# Patient Record
Sex: Female | Born: 1970
Health system: Southern US, Community
[De-identification: ages and names within clinical notes are randomized; demographics above are authoritative.]

## PROBLEM LIST (undated history)

## (undated) DIAGNOSIS — R7301 Impaired fasting glucose: Secondary | ICD-10-CM

## (undated) DIAGNOSIS — Z973 Presence of spectacles and contact lenses: Secondary | ICD-10-CM

## (undated) DIAGNOSIS — E785 Hyperlipidemia, unspecified: Secondary | ICD-10-CM

## (undated) DIAGNOSIS — R748 Abnormal levels of other serum enzymes: Secondary | ICD-10-CM

## (undated) DIAGNOSIS — Z5189 Encounter for other specified aftercare: Secondary | ICD-10-CM

## (undated) DIAGNOSIS — I10 Essential (primary) hypertension: Secondary | ICD-10-CM

## (undated) HISTORY — DX: Encounter for other specified aftercare: Z51.89

## (undated) HISTORY — DX: Essential (primary) hypertension: I10

## (undated) HISTORY — DX: Presence of spectacles and contact lenses: Z97.3

## (undated) HISTORY — DX: Hyperlipidemia, unspecified: E78.5

## (undated) HISTORY — PX: BREAST EXCISIONAL BIOPSY: SUR124

---
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
## 2008-11-29 DIAGNOSIS — I10 Essential (primary) hypertension: Secondary | ICD-10-CM

## 2008-11-29 HISTORY — DX: Essential (primary) hypertension: I10

## 2008-11-29 HISTORY — PX: MYOMECTOMY: SHX85

## 2011-11-30 HISTORY — PX: OVARIAN CYST REMOVAL: SHX89

## 2016-12-30 DIAGNOSIS — I10 Essential (primary) hypertension: Secondary | ICD-10-CM | POA: Diagnosis not present

## 2017-01-17 ENCOUNTER — Encounter: Payer: Self-pay | Admitting: Medical

## 2017-01-17 ENCOUNTER — Ambulatory Visit (INDEPENDENT_AMBULATORY_CARE_PROVIDER_SITE_OTHER): Payer: BLUE CROSS/BLUE SHIELD | Admitting: Medical

## 2017-01-17 VITALS — BP 118/80 | HR 72 | Ht 67.75 in | Wt 150.6 lb

## 2017-01-17 DIAGNOSIS — I10 Essential (primary) hypertension: Secondary | ICD-10-CM | POA: Diagnosis not present

## 2017-01-17 DIAGNOSIS — Z0000 Encounter for general adult medical examination without abnormal findings: Secondary | ICD-10-CM | POA: Diagnosis not present

## 2017-01-17 LAB — POCT URINALYSIS DIPSTICK
BILIRUBIN UA: NEGATIVE
Blood, UA: NEGATIVE
Glucose, UA: NEGATIVE
Ketones, UA: NEGATIVE
LEUKOCYTES UA: NEGATIVE
Nitrite, UA: NEGATIVE
PH UA: 6.5
PROTEIN UA: NEGATIVE
Spec Grav, UA: 1.025
UROBILINOGEN UA: NEGATIVE

## 2017-01-17 LAB — COMPREHENSIVE METABOLIC PANEL
ALBUMIN: 3.9 g/dL (ref 3.6–5.1)
ALT: 11 U/L (ref 6–29)
AST: 16 U/L (ref 10–35)
Alkaline Phosphatase: 110 U/L (ref 33–115)
BUN: 11 mg/dL (ref 7–25)
CHLORIDE: 101 mmol/L (ref 98–110)
CO2: 28 mmol/L (ref 20–31)
Calcium: 9.3 mg/dL (ref 8.6–10.2)
Creat: 0.82 mg/dL (ref 0.50–1.10)
Glucose, Bld: 85 mg/dL (ref 65–99)
POTASSIUM: 4.1 mmol/L (ref 3.5–5.3)
Sodium: 138 mmol/L (ref 135–146)
TOTAL PROTEIN: 7.7 g/dL (ref 6.1–8.1)
Total Bilirubin: 0.4 mg/dL (ref 0.2–1.2)

## 2017-01-17 LAB — CBC
HEMATOCRIT: 37.4 % (ref 35.0–45.0)
HEMOGLOBIN: 12 g/dL (ref 11.7–15.5)
MCH: 28.3 pg (ref 27.0–33.0)
MCHC: 32.1 g/dL (ref 32.0–36.0)
MCV: 88.2 fL (ref 80.0–100.0)
MPV: 9.3 fL (ref 7.5–12.5)
Platelets: 418 10*3/uL — ABNORMAL HIGH (ref 140–400)
RBC: 4.24 MIL/uL (ref 3.80–5.10)
RDW: 14.6 % (ref 11.0–15.0)
WBC: 7.9 10*3/uL (ref 4.0–10.5)

## 2017-01-17 LAB — LIPID PANEL
CHOL/HDL RATIO: 5.9 ratio — AB (ref <5.0)
Cholesterol: 183 mg/dL (ref <200)
HDL: 31 mg/dL — AB (ref >50)
LDL CALC: 134 mg/dL — AB (ref <100)
TRIGLYCERIDES: 91 mg/dL (ref <150)
VLDL: 18 mg/dL (ref <30)

## 2017-01-17 LAB — TSH: TSH: 0.8 mIU/L

## 2017-01-17 NOTE — Progress Notes (Signed)
Subjective:   HPI  Anne Bender is a 46 y.o. female who presents for a complete physical.  New patient today.  Medical team: Cardiology - [place] establishing today for primary care OB/Gyn - [facility] eye doctor Dr. Dimas Chyleglethorp  Concerns: Use [place], had PCP there.   Been in [place] 51mo.     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    Has degree and background in early childhood education and research.  Is on BP medication, diagnosed 2010, checks BPs, usually normal.   Takes both medications daily  Hx/o pregnancy x 6, 2 live births (one child is adopted), 4 miscarriages.   Not sure if she has had blood clot testing, but saw reproductive endocrinologist at one point.   Periods regular.  Husband had vasectomy.  Reviewed their medical, surgical, family, social, medication, and allergy history and updated chart as appropriate.  Past Medical History:  Diagnosis Date  . Blood transfusion without reported diagnosis    with prior gynecology surgery  . Hypertension 2010  . Wears glasses     Past Surgical History:  Procedure Laterality Date  . CESAREAN SECTION  2010  . MYOMECTOMY  2010  . OVARIAN CYST REMOVAL  2013    Social History   Social History  . Marital status: Married    Spouse name: N/A  . Number of children: N/A  . Years of education: N/A   Occupational History  . Not on file.   Social History Main Topics  . Smoking status: Never Smoker  . Smokeless tobacco: Never Used  . Alcohol use No  . Drug use: No  . Sexual activity: Not on file   Other Topics Concern  . Not on file   Social History Narrative   Married, has 3 children, ages 47yo, 347yo, 312yo.  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   Walks some for exercise.   12/2016    Family History  Problem Relation Age of Onset  . Stroke Mother [id number]  . Hypertension Mother    . Pulmonary embolism Father   . Hypertension Father   . Peripheral vascular disease Father   . Diabetes Paternal Grandmother   . Hypertension Paternal Grandmother   . Cancer Paternal Grandmother     breast  . Cancer Maternal Aunt     breast     Current Outpatient Prescriptions:  .  metoprolol succinate (TOPROL-XL) 25 MG 24 hr tablet, Take 25 mg by mouth daily., Disp: , Rfl: 0 .  valsartan-hydrochlorothiazide (DIOVAN-HCT) 160-12.5 MG tablet, Take 1 tablet by mouth daily., Disp: , Rfl: 2  No Known Allergies    Review of Systems Constitutional: -fever, -chills, -sweats, -unexpected weight change, -decreased appetite, -fatigue Allergy: -sneezing, -itching, -congestion Dermatology: -changing moles, --rash, -lumps ENT: -runny nose, -ear pain, -sore throat, -hoarseness, -sinus pain, -teeth pain, - ringing in ears, -hearing loss, -nosebleeds Cardiology: -chest pain, -palpitations, -swelling, -difficulty breathing when lying flat, -waking up short of breath Respiratory: -cough, -shortness of breath, -difficulty breathing with exercise or exertion, -wheezing, -coughing up blood Gastroenterology: -abdominal pain, -nausea, -vomiting, -diarrhea, -constipation, -blood in stool, -changes in bowel movement, -difficulty swallowing or eating Hematology: -bleeding, -bruising  Musculoskeletal: -joint aches, -muscle aches, -joint swelling, -back pain, -neck pain, -cramping, -changes in gait Ophthalmology: denies vision changes, eye redness, itching, discharge Urology: -burning with urination, -difficulty urinating, -blood in urine, -urinary frequency, -urgency, -incontinence  Neurology: -headache, -weakness, -tingling, -numbness, -memory loss, -falls, -dizziness Psychology: -depressed mood, -agitation, -sleep problems     Objective:   Physical Exam  BP 118/80   Pulse 72   Ht 5' 7.75" (1.721 m)   Wt 150 lb 9.6 oz (68.3 kg)   LMP 12/27/2016   BMI 23.07 kg/m   General appearance: alert, no  distress, WD/WN, AA female Skin: left lateral orbit with 2 small pedunculated skin tags, other flat tags of bilat cheeks.   Some freckles noted.  No worrisome lesions HEENT: normocephalic, conjunctiva/corneas normal, sclerae anicteric, PERRLA, EOMi, nares patent, no discharge or erythema, pharynx normal Oral cavity: MMM, tongue normal, teeth normal Neck: supple, no lymphadenopathy, no thyromegaly, no masses, normal ROM, no bruits Chest: non tender, normal shape and expansion Heart: RRR, normal S1, S2, no murmurs Lungs: CTA bilaterally, no wheezes, rhonchi, or rales Abdomen: +bs, soft, non tender, non distended, no masses, no hepatomegaly, no splenomegaly, no bruits Back: non tender, normal ROM, no scoliosis Musculoskeletal: upper extremities non tender, no obvious deformity, normal ROM throughout, lower extremities non tender, no obvious deformity, normal ROM throughout Extremities: no edema, no cyanosis, no clubbing Pulses: 2+ symmetric, upper and lower extremities, normal cap refill Neurological: alert, oriented x 3, CN2-12 intact, strength normal upper extremities and lower extremities, sensation normal throughout, DTRs 2+ throughout, no cerebellar signs, gait normal Psychiatric: normal affect, behavior normal, pleasant  Breast/gyn/rectal - deferred to gyn    Assessment and Plan :    Encounter Diagnoses  Name Primary?  . Routine general medical examination at a health care facility Yes  . Essential hypertension, benign     Physical exam - discussed healthy lifestyle, diet, exercise, preventative care, vaccinations, and addressed their concerns.  Handout given. See your eye doctor yearly for routine vision care. See your dentist yearly for routine dental care including hygiene visits twice yearly. She plans to establish with gyn discussed possibly doing a hypercoagulable panel given her and father's history.   She will consider C/t same BP medication She is up to date on flu and Td  vaccines.  Discussed cancer screens, pap, mammo, at age 1yo colonoscopy Follow-up pending labs  Anne Bender was seen today for new pt.  Diagnoses and all orders for this visit:  Routine general medical examination at a health care facility -     Urinalysis Dipstick -     Comprehensive metabolic panel -     CBC -     Lipid panel -     TSH  Essential hypertension, benign -     Comprehensive metabolic panel -     Lipid panel

## 2017-01-17 NOTE — Patient Instructions (Signed)
Dr. Jonna Coup, dentist [address], [place], [place] [address] [phone] [email or web address]   Health Maintenance, Female Introduction Adopting a healthy lifestyle and getting preventive care can go a long way to promote health and wellness. Talk with your health care provider about what schedule of regular examinations is right for you. This is a good chance for you to check in with your provider about disease prevention and staying healthy. In between checkups, there are plenty of things you can do on your own. Experts have done a lot of research about which lifestyle changes and preventive measures are most likely to keep you healthy. Ask your health care provider for more information. Weight and diet Eat a healthy diet  Be sure to include plenty of vegetables, fruits, low-fat dairy products, and lean protein.  Do not eat a lot of foods high in solid fats, added sugars, or salt.  Get regular exercise. This is one of the most important things you can do for your health.  Most adults should exercise for at least 150 minutes each week. The exercise should increase your heart rate and make you sweat (moderate-intensity exercise).  Most adults should also do strengthening exercises at least twice a week. This is in addition to the moderate-intensity exercise. Maintain a healthy weight  Body mass index (BMI) is a measurement that can be used to identify possible weight problems. It estimates body fat based on height and weight. Your health care provider can help determine your BMI and help you achieve or maintain a healthy weight.  For females 47 years of age and older:  A BMI below 18.5 is considered underweight.  A BMI of 18.5 to 24.9 is normal.  A BMI of 25 to 29.9 is considered overweight.  A BMI of 30 and above is considered obese. Watch levels of cholesterol and blood lipids  You should start having your blood tested for lipids and cholesterol at 47 years of age, then have  this test every 5 years.  You may need to have your cholesterol levels checked more often if:  Your lipid or cholesterol levels are high.  You are older than 47 years of age.  You are at high risk for heart disease. Cancer screening Lung Cancer  Lung cancer screening is recommended for adults 10-47 years old who are at high risk for lung cancer because of a history of smoking.  A yearly low-dose CT scan of the lungs is recommended for people who:  Currently smoke.  Have quit within the past 15 years.  Have at least a 30-pack-year history of smoking. A pack year is smoking an average of one pack of cigarettes a day for 1 year.  Yearly screening should continue until it has been 15 years since you quit.  Yearly screening should stop if you develop a health problem that would prevent you from having lung cancer treatment. Breast Cancer  Practice breast self-awareness. This means understanding how your breasts normally appear and feel.  It also means doing regular breast self-exams. Let your health care provider know about any changes, no matter how small.  If you are in your 47 or 30s, you should have a clinical breast exam (CBE) by a health care provider every 1-3 years as part of a regular health exam.  If you are 47 or older, have a CBE every year. Also consider having a breast X-ray (mammogram) every year.  If you have a family history of breast cancer, talk to your health  care provider about genetic screening.  If you are at high risk for breast cancer, talk to your health care provider about having an MRI and a mammogram every year.  Breast cancer gene (BRCA) assessment is recommended for women who have family members with BRCA-related cancers. BRCA-related cancers include:  Breast.  Ovarian.  Tubal.  Peritoneal cancers.  Results of the assessment will determine the need for genetic counseling and BRCA1 and BRCA2 testing. Cervical Cancer  Your health care  provider may recommend that you be screened regularly for cancer of the pelvic organs (ovaries, uterus, and vagina). This screening involves a pelvic examination, including checking for microscopic changes to the surface of your cervix (Pap test). You may be encouraged to have this screening done every 3 years, beginning at age 47.  For women ages 47-65, health care providers may recommend pelvic exams and Pap testing every 3 years, or they may recommend the Pap and pelvic exam, combined with testing for human papilloma virus (HPV), every 5 years. Some types of HPV increase your risk of cervical cancer. Testing for HPV may also be done on women of any age with unclear Pap test results.  Other health care providers may not recommend any screening for nonpregnant women who are considered low risk for pelvic cancer and who do not have symptoms. Ask your health care provider if a screening pelvic exam is right for you.  If you have had past treatment for cervical cancer or a condition that could lead to cancer, you need Pap tests and screening for cancer for at least 20 years after your treatment. If Pap tests have been discontinued, your risk factors (such as having a new sexual partner) need to be reassessed to determine if screening should resume. Some women have medical problems that increase the chance of getting cervical cancer. In these cases, your health care provider may recommend more frequent screening and Pap tests. Colorectal Cancer  This type of cancer can be detected and often prevented.  Routine colorectal cancer screening usually begins at 47 years of age and continues through 47 years of age.  Your health care provider may recommend screening at an earlier age if you have risk factors for colon cancer.  Your health care provider may also recommend using home test kits to check for hidden blood in the stool.  A small camera at the end of a tube can be used to examine your colon directly  (sigmoidoscopy or colonoscopy). This is done to check for the earliest forms of colorectal cancer.  Routine screening usually begins at age 47.  Direct examination of the colon should be repeated every 5-10 years through 47 years of age. However, you may need to be screened more often if early forms of precancerous polyps or small growths are found. Skin Cancer  Check your skin from head to toe regularly.  Tell your health care provider about any new moles or changes in moles, especially if there is a change in a mole's shape or color.  Also tell your health care provider if you have a mole that is larger than the size of a pencil eraser.  Always use sunscreen. Apply sunscreen liberally and repeatedly throughout the day.  Protect yourself by wearing long sleeves, pants, a wide-brimmed hat, and sunglasses whenever you are outside. Heart disease, diabetes, and high blood pressure  High blood pressure causes heart disease and increases the risk of stroke. High blood pressure is more likely to develop in:  People  who have blood pressure in the high end of the normal range (130-139/85-89 mm Hg).  People who are overweight or obese.  People who are African American.  If you are 6-38 years of age, have your blood pressure checked every 3-5 years. If you are 89 years of age or older, have your blood pressure checked every year. You should have your blood pressure measured twice-once when you are at a hospital or clinic, and once when you are not at a hospital or clinic. Record the average of the two measurements. To check your blood pressure when you are not at a hospital or clinic, you can use:  An automated blood pressure machine at a pharmacy.  A home blood pressure monitor.  If you are between 64 years and 3 years old, ask your health care provider if you should take aspirin to prevent strokes.  Have regular diabetes screenings. This involves taking a blood sample to check your  fasting blood sugar level.  If you are at a normal weight and have a low risk for diabetes, have this test once every three years after 47 years of age.  If you are overweight and have a high risk for diabetes, consider being tested at a younger age or more often. Preventing infection Hepatitis B  If you have a higher risk for hepatitis B, you should be screened for this virus. You are considered at high risk for hepatitis B if:  You were born in a country where hepatitis B is common. Ask your health care provider which countries are considered high risk.  Your parents were born in a high-risk country, and you have not been immunized against hepatitis B (hepatitis B vaccine).  You have HIV or AIDS.  You use needles to inject street drugs.  You live with someone who has hepatitis B.  You have had sex with someone who has hepatitis B.  You get hemodialysis treatment.  You take certain medicines for conditions, including cancer, organ transplantation, and autoimmune conditions. Hepatitis C  Blood testing is recommended for:  Everyone born from [birth year] through 1966.  Anyone with known risk factors for hepatitis C. Sexually transmitted infections (STIs)  You should be screened for sexually transmitted infections (STIs) including gonorrhea and chlamydia if:  You are sexually active and are younger than 47 years of age.  You are older than 47 years of age and your health care provider tells you that you are at risk for this type of infection.  Your sexual activity has changed since you were last screened and you are at an increased risk for chlamydia or gonorrhea. Ask your health care provider if you are at risk.  If you do not have HIV, but are at risk, it may be recommended that you take a prescription medicine daily to prevent HIV infection. This is called pre-exposure prophylaxis (PrEP). You are considered at risk if:  You are sexually active and do not regularly use condoms or  know the HIV status of your partner(s).  You take drugs by injection.  You are sexually active with a partner who has HIV. Talk with your health care provider about whether you are at high risk of being infected with HIV. If you choose to begin PrEP, you should first be tested for HIV. You should then be tested every 3 months for as long as you are taking PrEP. Pregnancy  If you are premenopausal and you may become pregnant, ask your health care provider about preconception  counseling.  If you may become pregnant, take 400 to 800 micrograms (mcg) of folic acid every day.  If you want to prevent pregnancy, talk to your health care provider about birth control (contraception). Osteoporosis and menopause  Osteoporosis is a disease in which the bones lose minerals and strength with aging. This can result in serious bone fractures. Your risk for osteoporosis can be identified using a bone density scan.  If you are 74 years of age or older, or if you are at risk for osteoporosis and fractures, ask your health care provider if you should be screened.  Ask your health care provider whether you should take a calcium or vitamin D supplement to lower your risk for osteoporosis.  Menopause may have certain physical symptoms and risks.  Hormone replacement therapy may reduce some of these symptoms and risks. Talk to your health care provider about whether hormone replacement therapy is right for you. Follow these instructions at home:  Schedule regular health, dental, and eye exams.  Stay current with your immunizations.  Do not use any tobacco products including cigarettes, chewing tobacco, or electronic cigarettes.  If you are pregnant, do not drink alcohol.  If you are breastfeeding, limit how much and how often you drink alcohol.  Limit alcohol intake to no more than 1 drink per day for nonpregnant women. One drink equals 12 ounces of beer, 5 ounces of wine, or 1 ounces of hard  liquor.  Do not use street drugs.  Do not share needles.  Ask your health care provider for help if you need support or information about quitting drugs.  Tell your health care provider if you often feel depressed.  Tell your health care provider if you have ever been abused or do not feel safe at home. This information is not intended to replace advice given to you by your health care provider. Make sure you discuss any questions you have with your health care provider. Document Released: 05/31/2011 Document Revised: 04/22/2016 Document Reviewed: 08/19/2015  2017 Elsevier

## 2017-01-18 ENCOUNTER — Other Ambulatory Visit: Payer: Self-pay | Admitting: Medical

## 2017-01-18 MED ORDER — METOPROLOL SUCCINATE ER 25 MG PO TB24: 25.0000 mg | ORAL_TABLET | Freq: Every day | ORAL | 1 refills | Status: DC

## 2017-01-18 MED ORDER — VALSARTAN-HYDROCHLOROTHIAZIDE 160-12.5 MG PO TABS: 1.0000 | ORAL_TABLET | Freq: Every day | ORAL | 1 refills | Status: DC

## 2017-01-28 ENCOUNTER — Encounter: Payer: Self-pay | Admitting: Medical

## 2017-06-29 ENCOUNTER — Encounter: Payer: Self-pay | Admitting: Medical

## 2017-06-29 ENCOUNTER — Ambulatory Visit (INDEPENDENT_AMBULATORY_CARE_PROVIDER_SITE_OTHER): Payer: BLUE CROSS/BLUE SHIELD | Admitting: Medical

## 2017-06-29 ENCOUNTER — Telehealth: Payer: Self-pay

## 2017-06-29 VITALS — BP 124/78 | HR 80 | Wt 158.0 lb

## 2017-06-29 DIAGNOSIS — I10 Essential (primary) hypertension: Secondary | ICD-10-CM

## 2017-06-29 DIAGNOSIS — R748 Abnormal levels of other serum enzymes: Secondary | ICD-10-CM

## 2017-06-29 MED ORDER — TELMISARTAN-HCTZ 40-12.5 MG PO TABS: 1.0000 | ORAL_TABLET | Freq: Every day | ORAL | 1 refills | Status: DC

## 2017-06-29 NOTE — Telephone Encounter (Signed)
Attempted to obtain requested medical records from Dr. Elinor ParkinsonKunesh office in [place]. Faxed request on 12/29/2016, and 01/14/2017. Mailed request on 01/28/2017. Dr. Leafy HalfKunesh's office closed on June 14th and all records are handled by [facility], [phone].   Will attempt to contact centralized medical records for records. Faxed request to them at [phone]. Trixie Rude/RLB

## 2017-06-29 NOTE — Progress Notes (Signed)
Subjective: Chief Complaint  Patient presents with  . med check and fasting labs    med check Nand fasting labs, wants to change her b/p meds.   Here for f/u on abnormal lipids and BP from 12/2016.    Walking for exercise about 2-3 days per week.  Was doing a exercise challenge at work in February, lasted 2 months .  Was walking every day.  Likes tennis and racquet ball.    Played tennis in college.  Diet is not so healthy at times.  She is compliant with Diovan HCT but needs to change this due to national recall on valsartan.  Not currently on lipid medication.  No other aggravating or relieving factors. No other complaint.   Past Medical History:  Diagnosis Date  . Blood transfusion without reported diagnosis    with prior gynecology surgery  . Hypertension 2010  . Wears glasses    Current Outpatient Prescriptions on File Prior to Visit  Medication Sig Dispense Refill  . metoprolol succinate (TOPROL-XL) 25 MG 24 hr tablet Take 1 tablet (25 mg total) by mouth daily. 90 tablet 1   No current facility-administered medications on file prior to visit.    ROS as in subjective    Objective: BP 124/78   Pulse 80   Wt 158 lb (71.7 kg)   SpO2 98%   BMI 24.20 kg/m   General appearance: alert, no distress, WD/WN,  Neck: supple, no lymphadenopathy, no thyromegaly, no masses Heart: RRR, normal S1, S2, no murmurs Lungs: CTA bilaterally, no wheezes, rhonchi, or rales Ext: no edema Pulses: 2+ symmetric, upper and lower extremities, normal cap refill   Assessment: Encounter Diagnoses  Name Primary?  . Essential hypertension, benign Yes  . Low serum HDL     Plan: HTN - due to national recall on valsartan, change to Micardis HCT ,monitor BPs, and let me know within 22mo if readings are WNL.  Discussed risks/benefits of medication, of note, husband has had vasectomy  Low HDL - discussed heart healthy exercise, healthy diet, healthy oils in diet.   Discussed lipid panel from  12/2016.   Repeat lipids today.   Discussed goals and reducing heart disease risk.  Nasira was seen today for med check and fasting labs.  Diagnoses and all orders for this visit:  Essential hypertension, benign  Low serum HDL -     Lipid panel  Other orders -     telmisartan-hydrochlorothiazide (MICARDIS HCT) 40-12.5 MG tablet; Take 1 tablet by mouth daily.

## 2017-06-30 ENCOUNTER — Other Ambulatory Visit: Payer: Self-pay | Admitting: Medical

## 2017-06-30 DIAGNOSIS — E782 Mixed hyperlipidemia: Secondary | ICD-10-CM

## 2017-06-30 LAB — LIPID PANEL
Cholesterol: 180 mg/dL (ref <200)
HDL: 32 mg/dL — AB (ref >50)
LDL Cholesterol: 129 mg/dL — ABNORMAL HIGH (ref <100)
Total CHOL/HDL Ratio: 5.6 Ratio — ABNORMAL HIGH (ref <5.0)
Triglycerides: 97 mg/dL (ref <150)
VLDL: 19 mg/dL (ref <30)

## 2017-06-30 MED ORDER — PRAVASTATIN SODIUM 20 MG PO TABS: 20.0000 mg | ORAL_TABLET | Freq: Every evening | ORAL | 0 refills | Status: DC

## 2017-06-30 MED ORDER — METOPROLOL SUCCINATE ER 25 MG PO TB24: 25.0000 mg | ORAL_TABLET | Freq: Every day | ORAL | 1 refills | Status: DC

## 2017-07-12 DIAGNOSIS — Z01419 Encounter for gynecological examination (general) (routine) without abnormal findings: Secondary | ICD-10-CM | POA: Diagnosis not present

## 2017-07-12 DIAGNOSIS — R102 Pelvic and perineal pain: Secondary | ICD-10-CM | POA: Diagnosis not present

## 2017-07-12 DIAGNOSIS — Z124 Encounter for screening for malignant neoplasm of cervix: Secondary | ICD-10-CM | POA: Diagnosis not present

## 2017-07-12 DIAGNOSIS — Z1231 Encounter for screening mammogram for malignant neoplasm of breast: Secondary | ICD-10-CM | POA: Diagnosis not present

## 2017-07-12 LAB — HM MAMMOGRAPHY

## 2017-07-13 DIAGNOSIS — Z124 Encounter for screening for malignant neoplasm of cervix: Secondary | ICD-10-CM | POA: Diagnosis not present

## 2017-07-15 LAB — HM PAP SMEAR: HM PAP: NEGATIVE

## 2017-07-26 NOTE — Telephone Encounter (Signed)
Spoke with Anne Bender- she asked for me to refax request to her attention. Awaiting records.

## 2017-08-09 ENCOUNTER — Other Ambulatory Visit (INDEPENDENT_AMBULATORY_CARE_PROVIDER_SITE_OTHER): Payer: BLUE CROSS/BLUE SHIELD

## 2017-08-09 DIAGNOSIS — E782 Mixed hyperlipidemia: Secondary | ICD-10-CM | POA: Diagnosis not present

## 2017-08-09 DIAGNOSIS — Z23 Encounter for immunization: Secondary | ICD-10-CM | POA: Diagnosis not present

## 2017-08-09 LAB — LIPID PANEL
CHOL/HDL RATIO: 6.2 (calc) — AB (ref <5.0)
CHOLESTEROL: 156 mg/dL (ref <200)
HDL: 25 mg/dL — AB (ref >50)
LDL Cholesterol (Calc): 113 mg/dL (calc) — ABNORMAL HIGH
Non-HDL Cholesterol (Calc): 131 mg/dL (calc) — ABNORMAL HIGH (ref <130)
Triglycerides: 79 mg/dL (ref <150)

## 2017-08-09 LAB — ALT: ALT: 14 U/L (ref 6–29)

## 2017-08-12 ENCOUNTER — Other Ambulatory Visit: Payer: Self-pay | Admitting: Medical

## 2017-08-12 MED ORDER — PRAVASTATIN SODIUM 40 MG PO TABS: 40.0000 mg | ORAL_TABLET | Freq: Every evening | ORAL | 3 refills | Status: DC

## 2017-08-12 NOTE — Progress Notes (Signed)
pra

## 2017-08-17 DIAGNOSIS — R102 Pelvic and perineal pain: Secondary | ICD-10-CM | POA: Diagnosis not present

## 2017-08-17 DIAGNOSIS — D259 Leiomyoma of uterus, unspecified: Secondary | ICD-10-CM | POA: Diagnosis not present

## 2017-08-25 ENCOUNTER — Other Ambulatory Visit: Payer: Self-pay | Admitting: Medical

## 2017-09-05 ENCOUNTER — Ambulatory Visit (INDEPENDENT_AMBULATORY_CARE_PROVIDER_SITE_OTHER): Payer: BLUE CROSS/BLUE SHIELD | Admitting: Medical

## 2017-09-05 VITALS — BP 124/70 | HR 78 | Temp 98.4°F | Wt 157.0 lb

## 2017-09-05 DIAGNOSIS — J029 Acute pharyngitis, unspecified: Secondary | ICD-10-CM

## 2017-09-05 DIAGNOSIS — J020 Streptococcal pharyngitis: Secondary | ICD-10-CM | POA: Diagnosis not present

## 2017-09-05 LAB — POCT RAPID STREP A (OFFICE): Rapid Strep A Screen: POSITIVE — AB

## 2017-09-05 MED ORDER — AMOXICILLIN 500 MG PO CAPS: 500.0000 mg | ORAL_CAPSULE | Freq: Three times a day (TID) | ORAL | 0 refills | Status: DC

## 2017-09-05 NOTE — Progress Notes (Signed)
  Subjective: Anne Bender is a 46 y.o. female who presents for evaluation of sore throat.    Symptoms include  Bad sore throat, some cough, some mucous drainage, ear discomfort.   No fever, no NVD.   Using nothing for symptoms.  reports sick contacts, daughter has had cough for weeks.  Patient has not had a recent close exposure to someone with proven streptococcal pharyngitis.  No other aggravating or relieving factors.  No other c/o.  The following portions of the patient's history were reviewed and updated as appropriate: allergies, current medications, past medical history, past social history, past surgical history and problem list.  Past Medical History:  Diagnosis Date  . Blood transfusion without reported diagnosis    with prior gynecology surgery  . Hypertension 2010  . Wears glasses     ROS as in subjective    Objective: BP 124/70   Pulse 78   Temp 98.4 F (36.9 C)   Wt 157 lb (71.2 kg)   SpO2 99%   BMI 24.05 kg/m   General appearance: no distress, WD/WN, mildly ill-appearing HEENT: normocephalic, conjunctiva/corneas normal, sclerae anicteric, nares patent, no discharge or erythema, pharynx with erythema, tonsils bilat swollen, no abscess, no fluctuance, no exudate.  Oral cavity: MMM, no lesions  Neck: swollen shoddy anterior tender nodes, no thyromegaly Heart: RRR, normal S1, S2, no murmurs Lungs: CTA bilaterally, no wheezes, rhonchi, or rales   Laboratory Strep test done. Results:positive.     Assessment: Encounter Diagnoses  Name Primary?  . Strep pharyngitis Yes  . Sore throat      Plan: Advised that sore throat etiology appears to be bacterial.  Discussed symptoms, diagnosis, and possible complications including peritonsillar abscess formation.  Advised that they will be infectious for 24 hours after starting antibiotics.  Discussed means of prevention, precautions.  Supportive care recommended including OTC analgesics, salt water gargles, warm  fluids, good hydration, and rest.  Discussed signs or symptoms that would prompt immediate evaluation.   Call or return if worse or not improving in the next 2-3 days.  Patient voiced understanding of diagnosis, recommendations, and treatment plan.  Anne Bender was seen today for sore throat.  Diagnoses and all orders for this visit:  Strep pharyngitis  Sore throat -     Rapid Strep A  Other orders -     amoxicillin (AMOXIL) 500 MG capsule; Take 1 capsule (500 mg total) by mouth 3 (three) times daily.

## 2017-09-22 ENCOUNTER — Telehealth: Payer: Self-pay | Admitting: Medical

## 2017-09-22 NOTE — Telephone Encounter (Signed)
Rcvd New RX request for Telmisartan 40-12.5 mg #30. Along with a note from the pharmacy stating that insurance requires a new script.

## 2017-09-23 ENCOUNTER — Other Ambulatory Visit: Payer: Self-pay | Admitting: Medical

## 2017-09-23 MED ORDER — TELMISARTAN-HCTZ 40-12.5 MG PO TABS: 1.0000 | ORAL_TABLET | Freq: Every day | ORAL | 1 refills | Status: DC

## 2017-09-25 ENCOUNTER — Other Ambulatory Visit: Payer: Self-pay | Admitting: Medical

## 2017-09-27 ENCOUNTER — Telehealth: Payer: Self-pay | Admitting: Medical

## 2017-09-27 NOTE — Telephone Encounter (Signed)
Fax refill request  telmissartan-hctz 40-12.5mg   #30 written 08/25/17  Request for 90 day rx  Per insurance requirement

## 2017-09-27 NOTE — Telephone Encounter (Signed)
This was already done on 09/23/17 # 90 with 1 refill

## 2017-09-28 ENCOUNTER — Telehealth: Payer: Self-pay | Admitting: Family Medicine

## 2017-09-28 ENCOUNTER — Other Ambulatory Visit: Payer: Self-pay | Admitting: Medical

## 2017-09-28 MED ORDER — AMOXICILLIN 500 MG PO CAPS: 500.0000 mg | ORAL_CAPSULE | Freq: Three times a day (TID) | ORAL | 0 refills | Status: DC

## 2017-09-28 NOTE — Telephone Encounter (Signed)
Pt called she is at [place] in a conference.  She is starting to get the same symptoms from Strep throat.  [phone] [place]

## 2017-09-28 NOTE — Telephone Encounter (Signed)
Call it out to the [place] pharmacy

## 2017-09-28 NOTE — Telephone Encounter (Signed)
[facility] 

## 2017-10-10 ENCOUNTER — Other Ambulatory Visit: Payer: Self-pay | Admitting: Medical

## 2017-11-29 DIAGNOSIS — R748 Abnormal levels of other serum enzymes: Secondary | ICD-10-CM

## 2017-11-29 DIAGNOSIS — R7301 Impaired fasting glucose: Secondary | ICD-10-CM

## 2017-11-29 HISTORY — DX: Abnormal levels of other serum enzymes: R74.8

## 2017-11-29 HISTORY — DX: Impaired fasting glucose: R73.01

## 2018-01-11 DIAGNOSIS — N926 Irregular menstruation, unspecified: Secondary | ICD-10-CM | POA: Diagnosis not present

## 2018-03-29 ENCOUNTER — Other Ambulatory Visit: Payer: Self-pay | Admitting: Medical

## 2018-03-29 NOTE — Telephone Encounter (Signed)
Give #30 and get in for physical at this time

## 2018-03-31 ENCOUNTER — Other Ambulatory Visit: Payer: Self-pay | Admitting: Medical

## 2018-04-10 ENCOUNTER — Telehealth: Payer: Self-pay

## 2018-04-10 NOTE — Telephone Encounter (Signed)
Received voicemail from patient she need refill on Telmisartan-HCTZ  40-12.5 mg   Sent to her pharmacy.She does have an appt for CPE.

## 2018-04-11 NOTE — Telephone Encounter (Signed)
Refill was sent on 03/29/18 with 0 refills. Spoke to patient and she was notified of medication refill on 03/29/18. She was informed to call the office if she has any other questions about her prescription

## 2018-04-17 ENCOUNTER — Other Ambulatory Visit: Payer: Self-pay | Admitting: Medical

## 2018-04-17 NOTE — Telephone Encounter (Signed)
30 day supply was sent on 03/29/18 and patient was informed she needed to schedule an appointment. lmom today informing patient to call and schedule appt for further refills.

## 2018-04-26 ENCOUNTER — Encounter: Payer: Self-pay | Admitting: Medical

## 2018-05-11 ENCOUNTER — Encounter: Payer: Self-pay | Admitting: Medical

## 2018-05-17 ENCOUNTER — Encounter: Payer: Self-pay | Admitting: Medical

## 2018-05-17 ENCOUNTER — Ambulatory Visit (INDEPENDENT_AMBULATORY_CARE_PROVIDER_SITE_OTHER): Payer: BLUE CROSS/BLUE SHIELD | Admitting: Medical

## 2018-05-17 VITALS — BP 130/82 | HR 68 | Resp 16 | Ht 62.0 in | Wt 154.2 lb

## 2018-05-17 DIAGNOSIS — R0683 Snoring: Secondary | ICD-10-CM | POA: Diagnosis not present

## 2018-05-17 DIAGNOSIS — R5383 Other fatigue: Secondary | ICD-10-CM

## 2018-05-17 DIAGNOSIS — I10 Essential (primary) hypertension: Secondary | ICD-10-CM

## 2018-05-17 DIAGNOSIS — Z0000 Encounter for general adult medical examination without abnormal findings: Secondary | ICD-10-CM | POA: Diagnosis not present

## 2018-05-17 LAB — POCT URINALYSIS DIP (PROADVANTAGE DEVICE)
BILIRUBIN UA: NEGATIVE
GLUCOSE UA: NEGATIVE mg/dL
Ketones, POC UA: NEGATIVE mg/dL
Leukocytes, UA: NEGATIVE
Nitrite, UA: NEGATIVE
Protein Ur, POC: 30 mg/dL — AB
SPECIFIC GRAVITY, URINE: 1.03
UUROB: NEGATIVE
pH, UA: 6 (ref 5.0–8.0)

## 2018-05-17 NOTE — Progress Notes (Signed)
Subjective:   HPI  Anne Bender is a 47 y.o. female who presents for Chief Complaint  Patient presents with  . Annual Exam    fasting CPE    eye exam 03-19  Gyn appt due in Sept 2019 with Dr Normand Sloop     Medical care team includes: Delaina Fetsch, Kermit Balo, PA-C here for primary care Dentist Eye doctor Sees gynecology  Concerns: Fatigue in general  There has been comments about snoring, no witnessed apnea.  She notes recurrent sore throats in the past year.   Reviewed their medical, surgical, family, social, medication, and allergy history and updated chart as appropriate.  Past Medical History:  Diagnosis Date  . Blood transfusion without reported diagnosis    with prior gynecology surgery  . Hypertension 2010  . Wears glasses     Past Surgical History:  Procedure Laterality Date  . CESAREAN SECTION  2010  . MYOMECTOMY  2010  . OVARIAN CYST REMOVAL  2013    Social History   Socioeconomic History  . Marital status: Married    Spouse name: Not on file  . Number of children: Not on file  . Years of education: Not on file  . Highest education level: Not on file  Occupational History  . Not on file  Social Needs  . Financial resource strain: Not on file  . Food insecurity:    Worry: Not on file    Inability: Not on file  . Transportation needs:    Medical: Not on file    Non-medical: Not on file  Tobacco Use  . Smoking status: Never Smoker  . Smokeless tobacco: Never Used  Substance and Sexual Activity  . Alcohol use: No  . Drug use: No  . Sexual activity: Not on file  Lifestyle  . Physical activity:    Days per week: Not on file    Minutes per session: Not on file  . Stress: Not on file  Relationships  . Social connections:    Talks on phone: Not on file    Gets together: Not on file    Attends religious service: Not on file    Active member of club or organization: Not on file    Attends meetings of clubs or organizations: Not on file   Relationship status: Not on file  . Intimate partner violence:    Fear of current or ex partner: Not on file    Emotionally abused: Not on file    Physically abused: Not on file    Forced sexual activity: Not on file  Other Topics Concern  . Not on file  Social History Narrative   Married, has 3 children, [place], [place].  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   Walks some for exercise.   04/2018    Family History  Problem Relation Age of Onset  . Stroke Mother 79  . Hypertension Mother   . Pulmonary embolism Father   . Hypertension Father   . Peripheral vascular disease Father   . Diabetes Paternal Grandmother   . Hypertension Paternal Grandmother   . Cancer Paternal Grandmother        breast  . Cancer Maternal Aunt        breast     Current Outpatient Medications:  .  metoprolol succinate (TOPROL-XL) 25 MG 24 hr tablet, Take 1 tablet (25 mg total) by mouth daily., Disp: 90 tablet, Rfl: 1 .  pravastatin (PRAVACHOL) 20 MG tablet,  TAKE 1 TABLET BY MOUTH EVERY DAY IN THE EVENING, Disp: 90 tablet, Rfl: 0 .  telmisartan-hydrochlorothiazide (MICARDIS HCT) 40-12.5 MG tablet, TAKE 1 TABLET BY MOUTH EVERY DAY, Disp: 30 tablet, Rfl: 0  No Known Allergies   Review of Systems Constitutional: -fever, -chills, -sweats, -unexpected weight change, -decreased appetite, +fatigue Allergy: -sneezing, -itching, -congestion Dermatology: -changing moles, --rash, -lumps ENT: -runny nose, -ear pain, -sore throat, -hoarseness, -sinus pain, -teeth pain, - ringing in ears, -hearing loss, -nosebleeds Cardiology: -chest pain, -palpitations, -swelling, -difficulty breathing when lying flat, -waking up short of breath Respiratory: -cough, -shortness of breath, -difficulty breathing with exercise or exertion, -wheezing, -coughing up blood Gastroenterology: -abdominal pain, -nausea, -vomiting, -diarrhea, -constipation, -blood in stool, -changes in bowel movement,  -difficulty swallowing or eating Hematology: -bleeding, -bruising  Musculoskeletal: -joint aches, -muscle aches, -joint swelling, -back pain, -neck pain, -cramping, -changes in gait Ophthalmology: denies vision changes, eye redness, itching, discharge Urology: -burning with urination, -difficulty urinating, -blood in urine, -urinary frequency, -urgency, -incontinence Neurology: -headache, -weakness, -tingling, -numbness, -memory loss, -falls, -dizziness Psychology: -depressed mood, -agitation, -sleep problems Breast/gyn: -breast tenderness, -discharge, -lumps, -vaginal discharge,- irregular periods, -heavy periods     Objective:  BP 130/82   Pulse 68   Resp 16   Ht 5\' 2"  (1.575 m)   Wt 154 lb 3.2 oz (69.9 kg)   SpO2 98%   BMI 28.20 kg/m   General appearance: alert, no distress, WD/WN, African American female Skin: unremarkable HEENT: normocephalic, conjunctiva/corneas normal, sclerae anicteric, PERRLA, EOMi, nares patent, no discharge or erythema, pharynx normal Oral cavity: MMM, tongue normal, teeth in good repair, somewhat small airway Neck: supple, no lymphadenopathy, no thyromegaly, no masses, normal ROM, no bruits Chest: non tender, normal shape and expansion Heart: RRR, normal S1, S2, no murmurs Lungs: CTA bilaterally, no wheezes, rhonchi, or rales Abdomen: +bs, soft, non tender, non distended, no masses, no hepatomegaly, no splenomegaly, no bruits Back: non tender, normal ROM, no scoliosis Musculoskeletal: upper extremities non tender, no obvious deformity, normal ROM throughout, lower extremities non tender, no obvious deformity, normal ROM throughout Extremities: no edema, no cyanosis, no clubbing Pulses: 2+ symmetric, upper and lower extremities, normal cap refill Neurological: alert, oriented x 3, CN2-12 intact, strength normal upper extremities and lower extremities, sensation normal throughout, DTRs 2+ throughout, no cerebellar signs, gait normal Psychiatric: normal  affect, behavior normal, pleasant  Breast/gyn/rectal - deferred to gynecology    Adult ECG Report  Indication: physical, HTN  Rate: 58 bpm  Rhythm: sinus bradycardia  QRS Axis: 28 degrees  PR Interval:  QRS Duration: 88ms  QTc:  Conduction Disturbances: first-degree A-V block   Other Abnormalities: none  Patient's cardiac risk factors are: hypertension.  EKG comparison: none  Narrative Interpretation: sinus bradycardia, AV 1 block    Assessment and Plan :   Encounter Diagnoses  Name Primary?  . Routine general medical examination at a health care facility Yes  . Essential hypertension, benign   . Fatigue, unspecified type   . Snoring     Physical exam - discussed and counseled on healthy lifestyle, diet, exercise, preventative care, vaccinations, sick and well care, proper use of emergency dept and after hours care, and addressed their concerns.    Health screening: See your eye doctor yearly for routine vision care. See your dentist yearly for routine dental care including hygiene visits twice yearly.  Cancer screening Discussed and advised monthly self breast exams Mammogram and pap through gynecology  Colonoscopy:  She will call and  inquire about screening at 48yo vs 48yo  Vaccinations: Advised yearly influenza vaccine  Separate significant issues discussed: fatigue - labs today, consider sleep study, needs to work on increased exercise, healthy diet.  Of note, EKG reviewed after she left, type 1 AV block.  [place] was seen today for annual exam.  Diagnoses and all orders for this visit:  Routine general medical examination at a health care facility -     POCT Urinalysis DIP (Proadvantage Device) -     Comprehensive metabolic panel -     CBC -     TSH -     Lipid panel -     Hemoglobin A1c -     VITAMIN D 25 Hydroxy (Vit-D Deficiency, Fractures) -     EKG 12-Lead  Essential hypertension, benign -     Hemoglobin A1c  Fatigue, unspecified  type -     CBC -     TSH  Snoring   Follow-up pending labs, yearly for physical

## 2018-05-18 ENCOUNTER — Other Ambulatory Visit: Payer: Self-pay | Admitting: Medical

## 2018-05-18 LAB — LIPID PANEL
Chol/HDL Ratio: 4.6 ratio — ABNORMAL HIGH (ref 0.0–4.4)
Cholesterol, Total: 137 mg/dL (ref 100–199)
HDL: 30 mg/dL — AB (ref >39)
LDL Calculated: 94 mg/dL (ref 0–99)
TRIGLYCERIDES: 66 mg/dL (ref 0–149)
VLDL Cholesterol Cal: 13 mg/dL (ref 5–40)

## 2018-05-18 LAB — COMPREHENSIVE METABOLIC PANEL WITH GFR
ALT: 18 IU/L (ref 0–32)
AST: 20 IU/L (ref 0–40)
Albumin/Globulin Ratio: 1.1 — ABNORMAL LOW (ref 1.2–2.2)
Albumin: 3.8 g/dL (ref 3.5–5.5)
Alkaline Phosphatase: 127 IU/L — ABNORMAL HIGH (ref 39–117)
BUN/Creatinine Ratio: 12 (ref 9–23)
BUN: 11 mg/dL (ref 6–24)
Bilirubin Total: 0.3 mg/dL (ref 0.0–1.2)
CO2: 24 mmol/L (ref 20–29)
Calcium: 8.9 mg/dL (ref 8.7–10.2)
Chloride: 100 mmol/L (ref 96–106)
Creatinine, Ser: 0.95 mg/dL (ref 0.57–1.00)
GFR calc Af Amer: 82 mL/min/1.73
GFR calc non Af Amer: 72 mL/min/1.73
Globulin, Total: 3.4 g/dL (ref 1.5–4.5)
Glucose: 87 mg/dL (ref 65–99)
Potassium: 4.1 mmol/L (ref 3.5–5.2)
Sodium: 138 mmol/L (ref 134–144)
Total Protein: 7.2 g/dL (ref 6.0–8.5)

## 2018-05-18 LAB — CBC
Hematocrit: 36.5 % (ref 34.0–46.6)
Hemoglobin: 11.8 g/dL (ref 11.1–15.9)
MCH: 29.4 pg (ref 26.6–33.0)
MCHC: 32.3 g/dL (ref 31.5–35.7)
MCV: 91 fL (ref 79–97)
Platelets: 414 10*3/uL (ref 150–450)
RBC: 4.01 x10E6/uL (ref 3.77–5.28)
RDW: 14.2 % (ref 12.3–15.4)
WBC: 7.4 10*3/uL (ref 3.4–10.8)

## 2018-05-18 LAB — HEMOGLOBIN A1C
Est. average glucose Bld gHb Est-mCnc: 134 mg/dL
HEMOGLOBIN A1C: 6.3 % — AB (ref 4.8–5.6)

## 2018-05-18 LAB — TSH: TSH: 1.06 u[IU]/mL (ref 0.450–4.500)

## 2018-05-18 LAB — VITAMIN D 25 HYDROXY (VIT D DEFICIENCY, FRACTURES): Vit D, 25-Hydroxy: 33.2 ng/mL (ref 30.0–100.0)

## 2018-05-18 MED ORDER — TELMISARTAN-HCTZ 40-12.5 MG PO TABS: 1.0000 | ORAL_TABLET | Freq: Every day | ORAL | 3 refills | Status: DC

## 2018-05-18 MED ORDER — VITAMIN D 1000 UNITS PO TABS
1000.0000 [IU] | ORAL_TABLET | Freq: Every day | ORAL | 3 refills | Status: DC
Start: 2018-05-18 — End: 2019-05-07

## 2018-05-18 MED ORDER — PRAVASTATIN SODIUM 20 MG PO TABS: ORAL_TABLET | ORAL | 3 refills | Status: DC

## 2018-05-22 ENCOUNTER — Other Ambulatory Visit: Payer: Self-pay | Admitting: Medical

## 2018-05-22 MED ORDER — METOPROLOL SUCCINATE ER 25 MG PO TB24: 25.0000 mg | ORAL_TABLET | Freq: Every day | ORAL | 3 refills | Status: DC

## 2018-05-25 ENCOUNTER — Telehealth: Payer: Self-pay | Admitting: Medical

## 2018-05-25 NOTE — Telephone Encounter (Signed)
Received [facility]. Sending back for review.

## 2018-08-08 DIAGNOSIS — N921 Excessive and frequent menstruation with irregular cycle: Secondary | ICD-10-CM | POA: Diagnosis not present

## 2018-08-08 DIAGNOSIS — Z1231 Encounter for screening mammogram for malignant neoplasm of breast: Secondary | ICD-10-CM | POA: Diagnosis not present

## 2018-08-08 DIAGNOSIS — Z6828 Body mass index (BMI) 28.0-28.9, adult: Secondary | ICD-10-CM | POA: Diagnosis not present

## 2018-08-08 DIAGNOSIS — Z01419 Encounter for gynecological examination (general) (routine) without abnormal findings: Secondary | ICD-10-CM | POA: Diagnosis not present

## 2018-08-17 ENCOUNTER — Encounter: Payer: Self-pay | Admitting: Medical

## 2018-08-17 ENCOUNTER — Ambulatory Visit: Payer: BLUE CROSS/BLUE SHIELD | Admitting: Medical

## 2018-08-17 VITALS — BP 120/70 | HR 67 | Temp 97.7°F | Resp 16 | Ht 62.0 in | Wt 152.0 lb

## 2018-08-17 DIAGNOSIS — R7303 Prediabetes: Secondary | ICD-10-CM

## 2018-08-17 DIAGNOSIS — I10 Essential (primary) hypertension: Secondary | ICD-10-CM

## 2018-08-17 DIAGNOSIS — Z23 Encounter for immunization: Secondary | ICD-10-CM | POA: Diagnosis not present

## 2018-08-17 DIAGNOSIS — R8290 Unspecified abnormal findings in urine: Secondary | ICD-10-CM | POA: Diagnosis not present

## 2018-08-17 DIAGNOSIS — R748 Abnormal levels of other serum enzymes: Secondary | ICD-10-CM

## 2018-08-17 DIAGNOSIS — E559 Vitamin D deficiency, unspecified: Secondary | ICD-10-CM | POA: Diagnosis not present

## 2018-08-17 LAB — POCT URINALYSIS DIP (PROADVANTAGE DEVICE)
BILIRUBIN UA: NEGATIVE
BILIRUBIN UA: NEGATIVE mg/dL
Blood, UA: NEGATIVE
GLUCOSE UA: NEGATIVE mg/dL
Leukocytes, UA: NEGATIVE
Nitrite, UA: NEGATIVE
Protein Ur, POC: NEGATIVE mg/dL
SPECIFIC GRAVITY, URINE: 1.025
Urobilinogen, Ur: NEGATIVE
pH, UA: 6 (ref 5.0–8.0)

## 2018-08-17 NOTE — Progress Notes (Signed)
Subjective: Chief Complaint  Patient presents with  . follow up    fasting follow up   Since last visit quit a stressful job, happy about that.   Here today to follow-up on issues from her physical in June  She is compliant with her 2 blood pressure medicines her cholesterol medicine.  She is compliant with vitamin D 1000 units daily started last visit  She denies history of liver disease.  She is getting exercise, trying to eat healthy  No new complaints  Past Medical History:  Diagnosis Date  . Blood transfusion without reported diagnosis    with prior gynecology surgery  . Hypertension 2010  . Wears glasses    Current Outpatient Medications on File Prior to Visit  Medication Sig Dispense Refill  . cholecalciferol (VITAMIN D) 1000 units tablet Take 1 tablet (1,000 Units total) by mouth daily. 90 tablet 3  . metoprolol succinate (TOPROL-XL) 25 MG 24 hr tablet Take 1 tablet (25 mg total) by mouth daily. 90 tablet 3  . pravastatin (PRAVACHOL) 20 MG tablet TAKE 1 TABLET BY MOUTH EVERY DAY IN THE EVENING 90 tablet 3  . telmisartan-hydrochlorothiazide (MICARDIS HCT) 40-12.5 MG tablet Take 1 tablet by mouth daily. 90 tablet 3   No current facility-administered medications on file prior to visit.    ROS as in subjective   Objective: BP 120/70   Pulse 67   Temp 97.7 F (36.5 C) (Oral)   Resp 16   Ht 5\' 2"  (1.575 m)   Wt 152 lb (68.9 kg)   SpO2 97%   BMI 27.80 kg/m   Gen: wd, wn, nad Otherwise not examined    Assessment: Encounter Diagnoses  Name Primary?  . Alkaline phosphatase elevation Yes  . Vitamin D deficiency   . Low serum HDL   . Essential hypertension, benign   . Need for influenza vaccination   . Prediabetes      Plan: Hypertension-at goal, continue current medication  Low serum HDL-discussed diet, exercise, continue statin for risk factor reduction  Vitamin D deficiency-compliant with the thousand units daily from last visit, recheck lab  today  Elevated alkaline phosphatase- lab today, discussed possible causes, possibly related to low vitamin D.  Discussed other evaluation if the level is higher  Prediabetes-discussed lab findings last visit, diet, exercise, prevention of progression to diabetes  Counseled on the influenza virus vaccine.  Vaccine information sheet given.  Influenza vaccine given after consent obtained.   Lala was seen today for follow up.  Diagnoses and all orders for this visit:  Alkaline phosphatase elevation -     Hepatic function panel -     VITAMIN D 25 Hydroxy (Vit-D Deficiency, Fractures)  Vitamin D deficiency -     Hepatic function panel -     VITAMIN D 25 Hydroxy (Vit-D Deficiency, Fractures)  Low serum HDL  Essential hypertension, benign  Need for influenza vaccination  Prediabetes

## 2018-08-17 NOTE — Addendum Note (Signed)
Addended by: Derinda LateLAMPART, Davontae Prusinski G on: 08/17/2018 09:24 AM   Modules accepted: Orders

## 2018-08-18 LAB — HEPATIC FUNCTION PANEL
ALT: 16 IU/L (ref 0–32)
AST: 16 IU/L (ref 0–40)
Albumin: 3.9 g/dL (ref 3.5–5.5)
Alkaline Phosphatase: 139 IU/L — ABNORMAL HIGH (ref 39–117)
BILIRUBIN, DIRECT: 0.11 mg/dL (ref 0.00–0.40)
Bilirubin Total: 0.4 mg/dL (ref 0.0–1.2)
Total Protein: 7.3 g/dL (ref 6.0–8.5)

## 2018-08-18 LAB — VITAMIN D 25 HYDROXY (VIT D DEFICIENCY, FRACTURES): Vit D, 25-Hydroxy: 40.9 ng/mL (ref 30.0–100.0)

## 2018-08-21 ENCOUNTER — Other Ambulatory Visit: Payer: Self-pay | Admitting: Medical

## 2018-08-21 DIAGNOSIS — R748 Abnormal levels of other serum enzymes: Secondary | ICD-10-CM

## 2018-08-22 LAB — ALKALINE PHOSPHATASE, ISOENZYMES
Alkaline Phosphatase: 142 IU/L — ABNORMAL HIGH (ref 39–117)
BONE FRACTION: 21 % (ref 14–68)
INTESTINAL FRAC.: 0 % (ref 0–18)
LIVER FRACTION: 79 % (ref 18–85)

## 2018-08-22 LAB — SPECIMEN STATUS REPORT

## 2019-01-09 ENCOUNTER — Encounter: Payer: Self-pay | Admitting: Medical

## 2019-01-09 ENCOUNTER — Ambulatory Visit (INDEPENDENT_AMBULATORY_CARE_PROVIDER_SITE_OTHER): Payer: BLUE CROSS/BLUE SHIELD | Admitting: Medical

## 2019-01-09 VITALS — BP 120/68 | HR 64 | Temp 98.1°F | Resp 16 | Ht 62.0 in | Wt 146.2 lb

## 2019-01-09 DIAGNOSIS — J069 Acute upper respiratory infection, unspecified: Secondary | ICD-10-CM

## 2019-01-09 DIAGNOSIS — R05 Cough: Secondary | ICD-10-CM | POA: Diagnosis not present

## 2019-01-09 DIAGNOSIS — R059 Cough, unspecified: Secondary | ICD-10-CM

## 2019-01-09 DIAGNOSIS — J029 Acute pharyngitis, unspecified: Secondary | ICD-10-CM

## 2019-01-09 LAB — POCT RAPID STREP A (OFFICE): RAPID STREP A SCREEN: NEGATIVE

## 2019-01-09 NOTE — Progress Notes (Signed)
  Subjective:     Patient ID: Anne Bender, female   DOB: 02/09/1971, 48 y.o.   MRN: [id number]  HPI Here for 2 day hx/o cough, sore throat, chest congestion, maybe a little ear discomfort.  No runny nose, no sneezing.  No fever, no vomiting .  Some + nausea.   Throat worse today, whereas cough worse yesterday.   Using mucinex.   Daughter has been hacking and coughing this past week.  Has 49 yo in daycare.   No specific strep contact.  No other aggravating or relieving factors. No other complaint.  Review of Systems As in subjective     Objective:   Physical Exam BP 120/68   Pulse 64   Temp 98.1 F (36.7 C) (Oral)   Resp 16   Ht 5\' 2"  (1.575 m)   Wt 146 lb 3.2 oz (66.3 kg)   SpO2 98%   BMI 26.74 kg/m   General appearance: alert, no distress, WD/WN,  HEENT: normocephalic, sclerae anicteric, TMs pearly, nares with moderate thick mucous on the right, but left nare patent, + erythema, pharynx with mild erythema Oral cavity: MMM, no lesions Neck: supple, no lymphadenopathy, no thyromegaly, no masses Lungs: CTA bilaterally, no wheezes, rhonchi, or rales        Assessment:     Encounter Diagnoses  Name Primary?  . Sore throat Yes  . Upper respiratory tract infection, unspecified type   . Cough         Plan:      Strep negative Symptoms exam suggest viral respiratory tract infection.  We discussed supportive care, continue rest, hydration, Mucinex DM, and discussed usual timeframe to see improvements.  Call back if not seeing improvement by the end of the week

## 2019-01-15 ENCOUNTER — Telehealth: Payer: Self-pay

## 2019-01-15 NOTE — Telephone Encounter (Signed)
Patient called and stated she is starting a new job and will need a tb test. Is this ok?

## 2019-01-16 ENCOUNTER — Other Ambulatory Visit: Payer: Self-pay | Admitting: Medical

## 2019-01-16 DIAGNOSIS — Z111 Encounter for screening for respiratory tuberculosis: Secondary | ICD-10-CM

## 2019-01-16 NOTE — Telephone Encounter (Signed)
I put the order in for QuantiFERON gold tuberculosis screening.  Have her let the front know what day she wants to come by for blood test

## 2019-01-17 ENCOUNTER — Other Ambulatory Visit: Payer: BLUE CROSS/BLUE SHIELD

## 2019-01-17 ENCOUNTER — Telehealth: Payer: Self-pay | Admitting: Internal Medicine

## 2019-01-17 DIAGNOSIS — Z111 Encounter for screening for respiratory tuberculosis: Secondary | ICD-10-CM

## 2019-01-17 NOTE — Telephone Encounter (Signed)
Pt has dropped off a form to be completed and signed. Pt had quantiferon gold plus blood test done today. Once this blood work comes back and form is signed she will come by and pick this up. This has been placed in your folder

## 2019-01-19 LAB — QUANTIFERON-TB GOLD PLUS
QUANTIFERON TB2 AG VALUE: 0.03 [IU]/mL
QuantiFERON Mitogen Value: >10 IU/mL
QuantiFERON Nil Value: 0.03 IU/mL
QuantiFERON TB1 Ag Value: 0.04 IU/mL
QuantiFERON-TB Gold Plus: NEGATIVE

## 2019-02-01 ENCOUNTER — Telehealth: Payer: Self-pay | Admitting: Family Medicine

## 2019-02-01 ENCOUNTER — Other Ambulatory Visit: Payer: Self-pay | Admitting: Medical

## 2019-02-01 MED ORDER — BENZONATATE 200 MG PO CAPS: 200.0000 mg | ORAL_CAPSULE | Freq: Three times a day (TID) | ORAL | 0 refills | Status: DC | PRN

## 2019-02-01 MED ORDER — AMOXICILLIN 875 MG PO TABS: 875.0000 mg | ORAL_TABLET | Freq: Two times a day (BID) | ORAL | 0 refills | Status: DC

## 2019-02-01 NOTE — Telephone Encounter (Signed)
Antibiotic and cough suppressant sent to pharmacy

## 2019-02-01 NOTE — Telephone Encounter (Signed)
Pt said she was here 2 weeks ago.  She is not better, she has been taking Mucinex and Robitussin.  Both not working. She now has a productive cough and wants to know if you will call her in a cough suppressant [facility].

## 2019-02-04 DIAGNOSIS — J069 Acute upper respiratory infection, unspecified: Secondary | ICD-10-CM | POA: Diagnosis not present

## 2019-03-30 ENCOUNTER — Other Ambulatory Visit: Payer: Self-pay

## 2019-03-30 ENCOUNTER — Ambulatory Visit (INDEPENDENT_AMBULATORY_CARE_PROVIDER_SITE_OTHER): Payer: BLUE CROSS/BLUE SHIELD | Admitting: Medical

## 2019-03-30 ENCOUNTER — Encounter: Payer: Self-pay | Admitting: Medical

## 2019-03-30 VITALS — Ht 62.0 in | Wt 145.0 lb

## 2019-03-30 DIAGNOSIS — F419 Anxiety disorder, unspecified: Secondary | ICD-10-CM | POA: Diagnosis not present

## 2019-03-30 DIAGNOSIS — F430 Acute stress reaction: Secondary | ICD-10-CM | POA: Diagnosis not present

## 2019-03-30 MED ORDER — LORAZEPAM 0.5 MG PO TABS: 0.5000 mg | ORAL_TABLET | Freq: Two times a day (BID) | ORAL | 0 refills | Status: DC | PRN

## 2019-03-30 NOTE — Progress Notes (Signed)
Subjective:     Patient ID: Anne Bender, female   DOB: 12/27/1970, 48 y.o.   MRN: [id number]  This visit type was conducted due to national recommendations for restrictions regarding the COVID-19 Pandemic (e.g. social distancing) in an effort to limit this patient's exposure and mitigate transmission in our community.  Due to their co-morbid illnesses, this patient is at least at moderate risk for complications without adequate follow up.  This format is felt to be most appropriate for this patient at this time.    Documentation for virtual audio and video telecommunications through Zoom encounter:  The patient was located at home. The provider was located in the office. The patient did consent to this visit and is aware of possible charges through their insurance for this visit.  The other persons participating in this telemedicine service were none. Time spent on call was 20 minutes and in review of previous records >25 minutes total.  This virtual service is not related to other E/M service within previous 7 days.   HPI Chief Complaint  Patient presents with  . anxiety    anxiety, emotional, X 2-3 weeks, depression   Virtual visit for mood.  Feels very emotion for past few weeks, thinks it may be stressed induced.    Been in the house for 7 weeks now.   Gets upset very easily.   Crying spells at times.    Has 3 children, oldest moved out, has 2 boys at home, one 49yo, one 49yo.  49yo has been challenging at home.  Just started telemed therapy for him, having some power struggles with him.  He is regressing to baby like behavior.   10yo struggling with online learning, missing his friends and Runner, broadcasting/film/videoteacher.  He wants to play video games all day.   Daughter is not living at home, and they haven't seen her in 7 weeks, but she is not doing social distancing.  Thus, they want let her come in for now.    Her mother is in a nursing home in [place] .  And she has not seen her since the  coronavirus stay at home measures.  She has talked with her son on the phone but not video.  She is concerned is 1 Chemical engineerstaff worker just tested positive for coronavius and she thinks a resident just became positive as well  She is working, started a new position March 9th, working from home.  Full time hours.   Husband working full time from home as well.   He is working from home, works for [employer].  Today has been fine, but all week has been emotional.   No other aggravating or relieving factors. No other complaint.   Past Medical History:  Diagnosis Date  . Blood transfusion without reported diagnosis    with prior gynecology surgery  . Hypertension 2010  . Wears glasses    Current Outpatient Medications on File Prior to Visit  Medication Sig Dispense Refill  . cholecalciferol (VITAMIN D) 1000 units tablet Take 1 tablet (1,000 Units total) by mouth daily. 90 tablet 3  . metoprolol succinate (TOPROL-XL) 25 MG 24 hr tablet Take 1 tablet (25 mg total) by mouth daily. 90 tablet 3  . pravastatin (PRAVACHOL) 20 MG tablet TAKE 1 TABLET BY MOUTH EVERY DAY IN THE EVENING 90 tablet 3  . telmisartan-hydrochlorothiazide (MICARDIS HCT) 40-12.5 MG tablet Take 1 tablet by mouth daily. 90 tablet 3   No current facility-administered medications on file prior to visit.  Review of Systems As in subjective    Objective:   Physical Exam  Ht 5\' 2"  (1.575 m)   Wt 145 lb (65.8 kg)   BMI 26.52 kg/m   Due to coronavirus pandemic stay at home measures, patient visit was virtual and they were not examined in person.   Gen: No acute distress, answers questions appropriately     Assessment:     Encounter Diagnoses  Name Primary?  Marland Kitchen Anxiety Yes  . Acute stress reaction        Plan:     We discussed her symptoms and concerns.  I empathize with her that she is not alone that we are all dealing with his coronavirus stress.  I advised that I note it is tough to have to working parents also  trying to homeschooled her kids and deal with all the stress of stay at home measures and the fact that her mother is in a nursing home facility with some recent positive patients with coronavirus makes it even worse stressful.  I discussed several ways to deal with the stressors including her husband both having personal downtime to take a break from the kids, getting the kids have break times as well, being extra supportive knowing that the kids do not understand the situation.  I advise she have good communication with the nursing home about testing and ensuring residents safety and trying to find a way to do some video FaceTime check with her mother.  I did prescribe her some short-term Ativan to use as needed.  She has been on SSRI in the past but did not do well with this.  She wants to avoid SSRI at this time I asked her to check back in with me in 1 to 2 weeks but we discussed several ways to cope  [name] was seen today for anxiety.  Diagnoses and all orders for this visit:  Anxiety  Acute stress reaction  Other orders -     LORazepam (ATIVAN) 0.5 MG tablet; Take 1 tablet (0.5 mg total) by mouth 2 (two) times daily as needed for anxiety.

## 2019-05-06 ENCOUNTER — Other Ambulatory Visit: Payer: Self-pay | Admitting: Medical

## 2019-05-07 NOTE — Telephone Encounter (Signed)
Is this ok to refill?  

## 2019-05-15 ENCOUNTER — Other Ambulatory Visit: Payer: Self-pay

## 2019-05-15 ENCOUNTER — Encounter: Payer: Self-pay | Admitting: Medical

## 2019-05-15 ENCOUNTER — Ambulatory Visit (INDEPENDENT_AMBULATORY_CARE_PROVIDER_SITE_OTHER): Payer: BC Managed Care – PPO | Admitting: Medical

## 2019-05-15 VITALS — BP 130/78 | HR 64 | Temp 97.8°F | Ht 62.0 in | Wt 145.8 lb

## 2019-05-15 DIAGNOSIS — G5602 Carpal tunnel syndrome, left upper limb: Secondary | ICD-10-CM | POA: Diagnosis not present

## 2019-05-15 DIAGNOSIS — R202 Paresthesia of skin: Secondary | ICD-10-CM

## 2019-05-15 DIAGNOSIS — M549 Dorsalgia, unspecified: Secondary | ICD-10-CM

## 2019-05-15 NOTE — Patient Instructions (Signed)
Recommendations  Begin over the counter Aleve, up to 2 tablets twice daily for 7-10 days  Begin OTC reinforced wrist splint/carpal tunnel splint x 2-3 weeks  Rest the left arm/hand when possible  Use ice water pack 20 minutes on/20 minutes off the next week  Even consider arm sling to rest the arm for an hour at a time  Continue chiropractor therapy  If not much improved in the next 3-4 weeks, then recheck      Carpal Tunnel Syndrome  Carpal tunnel syndrome is a condition that causes pain in your hand and arm. The carpal tunnel is a narrow area that is on the palm side of your wrist. Repeated wrist motion or certain diseases may cause swelling in the tunnel. This swelling can pinch the main nerve in the wrist (median nerve). What are the causes? This condition may be caused by:  Repeated wrist motions.  Wrist injuries.  Arthritis.  A sac of fluid (cyst) or abnormal growth (tumor) in the carpal tunnel.  Fluid buildup during pregnancy. Sometimes the cause is not known. What increases the risk? The following factors may make you more likely to develop this condition:  Having a job in which you move your wrist in the same way many times. This includes jobs like being a Midwifebutcher or a Conservation officer, naturecashier.  Being a woman.  Having other health conditions, such as: ? Diabetes. ? Obesity. ? A thyroid gland that is not active enough (hypothyroidism). ? Kidney failure. What are the signs or symptoms? Symptoms of this condition include:  A tingling feeling in your fingers.  Tingling or a loss of feeling (numbness) in your hand.  Pain in your entire arm. This pain may get worse when you bend your wrist and elbow for a long time.  Pain in your wrist that goes up your arm to your shoulder.  Pain that goes down into your palm or fingers.  A weak feeling in your hands. You may find it hard to grab and hold items. You may feel worse at night. How is this diagnosed? This condition is  diagnosed with a medical history and physical exam. You may also have tests, such as:  Electromyogram (EMG). This test checks the signals that the nerves send to the muscles.  Nerve conduction study. This test checks how well signals pass through your nerves.  Imaging tests, such as X-rays, ultrasound, and MRI. These tests check for what might be the cause of your condition. How is this treated? This condition may be treated with:  Lifestyle changes. You will be asked to stop or change the activity that caused your problem.  Doing exercise and activities that make bones and muscles stronger (physical therapy).  Learning how to use your hand again (occupational therapy).  Medicines for pain and swelling (inflammation). You may have injections in your wrist.  A wrist splint.  Surgery. Follow these instructions at home: If you have a splint:  Wear the splint as told by your doctor. Remove it only as told by your doctor.  Loosen the splint if your fingers: ? Tingle. ? Lose feeling (become numb). ? Turn cold and blue.  Keep the splint clean.  If the splint is not waterproof: ? Do not let it get wet. ? Cover it with a watertight covering when you take a bath or a shower. Managing pain, stiffness, and swelling   If told, put ice on the painful area: ? If you have a removable splint, remove it as  told by your doctor. ? Put ice in a plastic bag. ? Place a towel between your skin and the bag. ? Leave the ice on for 20 minutes, 2-3 times per day. General instructions  Take over-the-counter and prescription medicines only as told by your doctor.  Rest your wrist from any activity that may cause pain. If needed, talk with your boss at work about changes that can help your wrist heal.  Do any exercises as told by your doctor, physical therapist, or occupational therapist.  Keep all follow-up visits as told by your doctor. This is important. Contact a doctor if:  You have new  symptoms.  Medicine does not help your pain.  Your symptoms get worse. Get help right away if:  You have very bad numbness or tingling in your wrist or hand. Summary  Carpal tunnel syndrome is a condition that causes pain in your hand and arm.  It is often caused by repeated wrist motions.  Lifestyle changes and medicines are used to treat this problem. Surgery may help in very bad cases.  Follow your doctor's instructions about wearing a splint, resting your wrist, keeping follow-up visits, and calling for help. This information is not intended to replace advice given to you by your health care provider. Make sure you discuss any questions you have with your health care provider. Document Released: 11/04/2011 Document Revised: 03/24/2018 Document Reviewed: 03/24/2018 Elsevier Interactive Patient Education  2019 [employer].

## 2019-05-15 NOTE — Progress Notes (Signed)
Subjective Chief Complaint  Patient presents with  . Tingling    sensation radiating down left arm-has a pinched nerve in neck    Here for tingling in left arm and hand.   Has seen chiropractor for this before.  Was advised she has pinched nerve in neck.  Has had adjustments for this prior through chiropractor.   Last visit with chiropractor was last Friday, 4 days ago.   Yesterday felt tingling in left leg and toes.  No symptom in right side at all.  Was confused about left toes yesterday.  Was concerned about diabetes, vitamin deficiency or other.   In general she holds herself on her left hand, tends to lean out with her left toe bent downwards putting pressure on her left toe and will sit long enough that her left toe has a burning sensation.  She knows she tends to have bad posture.  She is actually right-handed.  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  She gets some exercise but does not do much stretching.  Does not have a set exercise regimen.  She does play outside in the yard work he had some during the week.  She does get some back pain occasionally.  Her main symptoms are left arm and left thumb and index finger and thumb tingling numbness for the last few weeks, some left upper back pain  No other aggravating relieving factors.  No other complaint.   Past Medical History:  Diagnosis Date  . Blood transfusion without reported diagnosis    with prior gynecology surgery  . Hypertension 2010  . Wears glasses    Current Outpatient Medications on File Prior to Visit  Medication Sig Dispense Refill  . [facility] D3 25 MCG (1000 UT) capsule TAKE 1 CAPSULE BY MOUTH EVERY DAY 90 capsule 0  . LORazepam (ATIVAN) 0.5 MG tablet Take 1 tablet (0.5 mg total) by mouth 2 (two) times daily as needed for anxiety. 30 tablet 0  . metoprolol succinate (TOPROL-XL) 25 MG 24 hr tablet Take 1 tablet (25 mg total) by  mouth daily. 90 tablet 3  . pravastatin (PRAVACHOL) 20 MG tablet TAKE 1 TABLET BY MOUTH EVERY DAY IN THE EVENING 90 tablet 0  . telmisartan-hydrochlorothiazide (MICARDIS HCT) 40-12.5 MG tablet TAKE 1 TABLET BY MOUTH EVERY DAY 90 tablet 0   No current facility-administered medications on file prior to visit.    ROS as in subjective    Objective: BP 130/78   Pulse 64   Temp 97.8 F (36.6 C)   Ht 5\' 2"  (1.575 m)   Wt 145 lb 12.8 oz (66.1 kg)   SpO2 99%   BMI 26.67 kg/m    Wt Readings from Last 3 Encounters:  05/15/19 145 lb 12.8 oz (66.1 kg)  03/30/19 145 lb (65.8 kg)  01/09/19 146 lb 3.2 oz (66.3 kg)   General: Well-developed well-nourished no acute distress Skin unremarkable Neck supple, non tender, normal range of motion, no mass no thyromegaly no lymphadenopathy Back non tender normal range motion no deformity Mild tenderness of left biceps origin and left AC joint, otherwise left arm non tender normal range of motion deformity no swelling, rest of bilateral upper extremities unremarkable with normal range of motion Bilateral lower extremities unremarkable normal range of motion Arms and legs neurovascular intact Negative Phalen's and Tinel's   Assessment: Encounter Diagnoses  Name  Primary?  . Carpal tunnel syndrome of left wrist Yes  . Arm paresthesia, left   . Upper back pain on left side   . Paresthesia of left foot      Plan: We discussed her symptoms and concerns.  I suspect her left toe symptoms are basically positional due to pressure changes from prolonged pressure on her toe that has nothing to do with her arm symptoms.  Her left arm symptoms suggest carpal tunnel symptoms that are mild.  I advised she begin reinforced over-the-counter arm splint for the next few weeks, Aleve over-the-counter, relative rest, arm sling occasionally, and plan to recheck in 3 to 4 weeks.  She can continue chiropractor therapy for now  Handout given today  She will return  soon for fasting labs and physical   Philippa was seen today for tingling.  Diagnoses and all orders for this visit:  Carpal tunnel syndrome of left wrist  Arm paresthesia, left  Upper back pain on left side  Paresthesia of left foot

## 2019-07-31 ENCOUNTER — Ambulatory Visit: Payer: BC Managed Care – PPO | Admitting: Medical

## 2019-08-02 ENCOUNTER — Other Ambulatory Visit: Payer: Self-pay | Admitting: Medical

## 2019-08-05 ENCOUNTER — Other Ambulatory Visit: Payer: Self-pay | Admitting: Medical

## 2019-08-07 ENCOUNTER — Encounter: Payer: Self-pay | Admitting: Medical

## 2019-08-07 ENCOUNTER — Other Ambulatory Visit: Payer: Self-pay

## 2019-08-07 ENCOUNTER — Ambulatory Visit (INDEPENDENT_AMBULATORY_CARE_PROVIDER_SITE_OTHER): Payer: BC Managed Care – PPO | Admitting: Medical

## 2019-08-07 VITALS — BP 120/70 | HR 68 | Temp 98.0°F | Ht 62.0 in | Wt 150.8 lb

## 2019-08-07 DIAGNOSIS — M542 Cervicalgia: Secondary | ICD-10-CM | POA: Diagnosis not present

## 2019-08-07 DIAGNOSIS — M549 Dorsalgia, unspecified: Secondary | ICD-10-CM | POA: Diagnosis not present

## 2019-08-07 DIAGNOSIS — M25512 Pain in left shoulder: Secondary | ICD-10-CM

## 2019-08-07 MED ORDER — HYDROCODONE-ACETAMINOPHEN 5-325 MG PO TABS: 1.0000 | ORAL_TABLET | Freq: Four times a day (QID) | ORAL | 0 refills | Status: DC | PRN

## 2019-08-07 MED ORDER — METHOCARBAMOL 500 MG PO TABS: 500.0000 mg | ORAL_TABLET | Freq: Two times a day (BID) | ORAL | 0 refills | Status: DC | PRN

## 2019-08-07 NOTE — Progress Notes (Signed)
Subjective: Chief Complaint  Patient presents with  . Shoulder Pain    left x 1 week    Here for left shoulder and upper back pain.  She notes her posture is terrible, particularly working from home.  Has tension in bilat upper back and shoulder.  Went to chiropractor to get adjustment.   Left shoulder still feels painful.   chiropractor told her she has lactic acid build up and she notes her left shoulder hasn't been same since the manipulation .  Only goes to chiropractor every now and then with tension.  [facility]es [facility].  First started with pain in upper back in shoulder the past week.    Is right handed.   No other injury or trauma.    Is feeling some clamp like pressure in left thumb.  Using up to 800mg  ibuprofen every 6 hours at times.  At night, using some advil PM to help sleep due to pain.  No other aggravating or relieving factors. No other complaint.  Past Medical History:  Diagnosis Date  . Blood transfusion without reported diagnosis    with prior gynecology surgery  . Hypertension 2010  . Wears glasses    Past Surgical History:  Procedure Laterality Date  . CESAREAN SECTION  2010  . MYOMECTOMY  2010  . OVARIAN CYST REMOVAL  2013   ROS as in subjective   Objective: BP 120/70   Pulse 68   Temp 98 F (36.7 C) (Oral)   Ht 5\' 2"  (1.575 m)   Wt 150 lb 12.8 oz (68.4 kg)   SpO2 98%   BMI 27.58 kg/m   Gen: wd, wn, nad Skin: unremarkable, no erythema, no bruising Neck: mild tenderness on left side, pain with neck ROM in general, ROM reduced about 5% in general, no mass, no thyromegaly MSK: tender left supraspinatus, tender over left AC joint, mildly reduced internal and external ROM left shoulder due to pain, mild pain with resisted shoulder flexion, mildly tender left trapezius, otherwise non tender, normal ROM, no laxity, rest of arm exam unremarkable Back: tender upper left paraspinal region, otherwise nontender, normal ROM Arms neurovascularly  intact     Assessment: Encounter Diagnoses  Name Primary?  Marland Kitchen. Upper back pain on left side Yes  . Acute pain of left shoulder   . Neck pain on left side      Plan: Symptoms and exam suggest muscle spasm and strain.  Discussed differential which could included shoulder pathology, radiculopathy, pinched nerve, or spasm.  I suspect spasm and strain of muscle.   Patient Instructions  Recommendations  Rest the next few days  Use gentle stretching range of motion exercises for the neck and shoulder and back  Begin the Robaxin muscle relaxer either at bedtime or up to twice a day as needed for muscle spasm and tension  You can continue ibuprofen over-the-counter twice daily the next few days, 600 or 800 mg at a time  For worse pain you can use the hydrocodone pain pill as needed.  Caution his Robaxin and the pain medicine can both cause sedation  After couple days if the pain is improving you can consider massage therapy  If not better at all within a week then let me know and we will schedule some x-rays of your neck and possibly left shoulder  Massage Therapy:  Sharen HintJanet Blevins [facility] [address] [place], [place] [id number] [phone] [email or web address]      Loralei was seen today  for shoulder pain.  Diagnoses and all orders for this visit:  Upper back pain on left side  Acute pain of left shoulder  Neck pain on left side  Other orders -     methocarbamol (ROBAXIN) 500 MG tablet; Take 1 tablet (500 mg total) by mouth 2 (two) times daily as needed for muscle spasms. -     HYDROcodone-acetaminophen (NORCO) 5-325 MG tablet; Take 1 tablet by mouth every 6 (six) hours as needed for moderate pain.   F/u soon for physical as planned

## 2019-08-07 NOTE — Patient Instructions (Addendum)
Recommendations  Rest the next few days  Use gentle stretching range of motion exercises for the neck and shoulder and back  Begin the Robaxin muscle relaxer either at bedtime or up to twice a day as needed for muscle spasm and tension  You can continue ibuprofen over-the-counter twice daily the next few days, 600 or 800 mg at a time  For worse pain you can use the hydrocodone pain pill as needed.  Caution his Robaxin and the pain medicine can both cause sedation  After couple days if the pain is improving you can consider massage therapy  If not better at all within a week then let me know and we will schedule some x-rays of your neck and possibly left shoulder  Massage Therapy:  Ernestene Mention [facility] [address] [place], [place] [address] [phone] [email or web address]

## 2019-08-07 NOTE — Telephone Encounter (Signed)
Patient has an appointment. Will wait to refill

## 2019-08-13 ENCOUNTER — Other Ambulatory Visit: Payer: Self-pay | Admitting: Medical

## 2019-08-13 ENCOUNTER — Telehealth: Payer: Self-pay

## 2019-08-13 DIAGNOSIS — M25512 Pain in left shoulder: Secondary | ICD-10-CM

## 2019-08-13 DIAGNOSIS — R202 Paresthesia of skin: Secondary | ICD-10-CM

## 2019-08-13 DIAGNOSIS — M542 Cervicalgia: Secondary | ICD-10-CM

## 2019-08-13 NOTE — Telephone Encounter (Signed)
Patient Anne Bender and stated she was seen last week for neck and shoulder pain and she was informed that if she didn't feel any better by the end of the week she should call back so x-ray orders can be placed. Please advise orders.

## 2019-08-13 NOTE — Telephone Encounter (Signed)
[place] go to [facility] for your neck and left shoulder xray.   Their hours are 8am - 4:30 pm Monday - Friday.  Take your insurance card with you.  [facility] [phone]  [address], [place], [place] [address]  [address] [place], [place] [address]

## 2019-08-13 NOTE — Progress Notes (Signed)
Dg c

## 2019-08-13 NOTE — Telephone Encounter (Signed)
Pt.notified

## 2019-08-14 ENCOUNTER — Ambulatory Visit
Admission: RE | Admit: 2019-08-14 | Discharge: 2019-08-14 | Disposition: A | Discharge status: Home / Self Care | Payer: BC Managed Care – PPO | Source: Ambulatory Visit | Attending: Medical | Admitting: Medical

## 2019-08-14 DIAGNOSIS — M25512 Pain in left shoulder: Secondary | ICD-10-CM

## 2019-08-14 DIAGNOSIS — R202 Paresthesia of skin: Secondary | ICD-10-CM

## 2019-08-14 DIAGNOSIS — M542 Cervicalgia: Secondary | ICD-10-CM

## 2019-08-14 DIAGNOSIS — M5030 Other cervical disc degeneration, unspecified cervical region: Secondary | ICD-10-CM | POA: Diagnosis not present

## 2019-08-16 ENCOUNTER — Other Ambulatory Visit: Payer: Self-pay

## 2019-08-16 DIAGNOSIS — M549 Dorsalgia, unspecified: Secondary | ICD-10-CM

## 2019-08-23 DIAGNOSIS — M7542 Impingement syndrome of left shoulder: Secondary | ICD-10-CM | POA: Diagnosis not present

## 2019-08-27 ENCOUNTER — Telehealth: Payer: Self-pay | Admitting: Medical

## 2019-08-27 NOTE — Telephone Encounter (Signed)
Please call and check in with her and pain.  I received a note from orthopedics from our recent referral that they tried to get a hold of her and cannot seem to get in touch with

## 2019-08-27 NOTE — Telephone Encounter (Signed)
Patient stated se feels much better. She went to [facility] and was given a prescription and is going to start physical therapy for her shoulder.

## 2019-08-28 ENCOUNTER — Ambulatory Visit: Payer: BC Managed Care – PPO | Admitting: Orthopaedic Surgery

## 2019-09-05 DIAGNOSIS — M25512 Pain in left shoulder: Secondary | ICD-10-CM | POA: Diagnosis not present

## 2019-09-20 ENCOUNTER — Other Ambulatory Visit: Payer: Self-pay

## 2019-09-20 DIAGNOSIS — M25512 Pain in left shoulder: Secondary | ICD-10-CM | POA: Diagnosis not present

## 2019-09-26 ENCOUNTER — Other Ambulatory Visit: Payer: Self-pay

## 2019-09-26 ENCOUNTER — Ambulatory Visit (INDEPENDENT_AMBULATORY_CARE_PROVIDER_SITE_OTHER): Payer: BC Managed Care – PPO | Admitting: Medical

## 2019-09-26 ENCOUNTER — Telehealth: Payer: Self-pay | Admitting: Medical

## 2019-09-26 ENCOUNTER — Encounter: Payer: Self-pay | Admitting: Medical

## 2019-09-26 VITALS — BP 120/62 | HR 70 | Temp 97.5°F | Ht 62.0 in | Wt 146.0 lb

## 2019-09-26 DIAGNOSIS — F430 Acute stress reaction: Secondary | ICD-10-CM

## 2019-09-26 DIAGNOSIS — R748 Abnormal levels of other serum enzymes: Secondary | ICD-10-CM

## 2019-09-26 DIAGNOSIS — R4586 Emotional lability: Secondary | ICD-10-CM

## 2019-09-26 DIAGNOSIS — Z23 Encounter for immunization: Secondary | ICD-10-CM

## 2019-09-26 DIAGNOSIS — R7303 Prediabetes: Secondary | ICD-10-CM

## 2019-09-26 DIAGNOSIS — I10 Essential (primary) hypertension: Secondary | ICD-10-CM

## 2019-09-26 DIAGNOSIS — Z0000 Encounter for general adult medical examination without abnormal findings: Secondary | ICD-10-CM

## 2019-09-26 DIAGNOSIS — E559 Vitamin D deficiency, unspecified: Secondary | ICD-10-CM | POA: Diagnosis not present

## 2019-09-26 NOTE — Telephone Encounter (Signed)
Pt dropped off form to be filled out please call when done get from [facility]

## 2019-09-26 NOTE — Progress Notes (Signed)
Subjective:   HPI  Anne Bender is a 48 y.o. female who presents for Chief Complaint  Patient presents with  . Annual Exam    fasting with flu shot     Patient Care Team: Tysinger, Camelia Eng, PA-C as PCP - General (Family Medicine) Sees dentist Sees eye doctor Sees gynecology  Concerns: Gets some mood swings, feeling down or not happy with job.  Feelings of not living to full potential.   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  [name] feeling this way for years. Both her youngest children are in therapy currently.  Husband is seeing a therapist.   Youngest defiant, her 49yo dealing with some anxiety issues.    compliant with medications  Seeing physical therapy, had eval with orthoepdicst recently.   Reviewed their medical, surgical, family, social, medication, and allergy history and updated chart as appropriate.  Past Medical History:  Diagnosis Date  . Alkaline phosphatase elevation 2019   isoenzymes normal, presumed related to vit D deficiency  . Blood transfusion without reported diagnosis    with prior gynecology surgery  . Hyperlipidemia   . Hypertension 2010  . Impaired fasting blood sugar 2019  . Wears glasses     Past Surgical History:  Procedure Laterality Date  . CESAREAN SECTION  2010  . MYOMECTOMY  2010  . OVARIAN CYST REMOVAL  2013    Social History   Socioeconomic History  . Marital status: Married    Spouse name: Not on file  . Number of children: Not on file  . Years of education: Not on file  . Highest education level: Not on file  Occupational History  . Not on file  Social Needs  . Financial resource strain: Not on file  . Food insecurity    Worry: Not on file    Inability: Not on file  . Transportation needs    Medical: Not on file    Non-medical: Not on file  Tobacco Use  . Smoking status: Never Smoker  . Smokeless tobacco: Never Used  Substance and Sexual Activity  . Alcohol use: Yes    Comment: rarely  . Drug use: No  . Sexual  activity: Not on file  Lifestyle  . Physical activity    Days per week: Not on file    Minutes per session: Not on file  . Stress: Not on file  Relationships  . Social Herbalist on phone: Not on file    Gets together: Not on file    Attends religious service: Not on file    Active member of club or organization: Not on file    Attends meetings of clubs or organizations: Not on file    Relationship status: Not on file  . Intimate partner violence    Fear of current or ex partner: Not on file    Emotionally abused: Not on file    Physically abused: Not on file    Forced sexual activity: Not on file  Other Topics Concern  . Not on file  Social History Narrative   Married, has 3 children, ages 76yo, 50yo, 18yo.  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  Exercises with her kids, PE with home school/virtual school.   08/2019    Family History  Problem Relation Age of Onset  . Stroke Mother 43  . Hypertension Mother   . Heart disease Mother 71  pacemaker, CHF  . Pulmonary embolism Father   . Hypertension Father   . Peripheral vascular disease Father   . Hypertension Sister   . Hypertension Brother   . Diabetes Paternal Grandmother   . Hypertension Paternal Grandmother   . Cancer Paternal Grandmother        breast  . Cancer Maternal Aunt        breast  . Hypertension Brother      Current Outpatient Medications:  .  [facility] D3 25 MCG (1000 UT) capsule, TAKE 1 CAPSULE BY MOUTH EVERY DAY, Disp: 90 capsule, Rfl: 0 .  metoprolol succinate (TOPROL-XL) 25 MG 24 hr tablet, TAKE 1 TABLET BY MOUTH EVERY DAY, Disp: 90 tablet, Rfl: 3 .  pravastatin (PRAVACHOL) 20 MG tablet, TAKE 1 TABLET BY MOUTH EVERY DAY IN THE EVENING, Disp: 90 tablet, Rfl: 0 .  telmisartan-hydrochlorothiazide (MICARDIS HCT) 40-12.5 MG tablet, TAKE 1 TABLET BY MOUTH EVERY DAY, Disp: 90 tablet, Rfl: 0 .   HYDROcodone-acetaminophen (NORCO) 5-325 MG tablet, Take 1 tablet by mouth every 6 (six) hours as needed for moderate pain. (Patient not taking: Reported on 09/26/2019), Disp: 10 tablet, Rfl: 0 .  LORazepam (ATIVAN) 0.5 MG tablet, Take 1 tablet (0.5 mg total) by mouth 2 (two) times daily as needed for anxiety. (Patient not taking: Reported on 09/26/2019), Disp: 30 tablet, Rfl: 0 .  methocarbamol (ROBAXIN) 500 MG tablet, Take 1 tablet (500 mg total) by mouth 2 (two) times daily as needed for muscle spasms. (Patient not taking: Reported on 09/26/2019), Disp: 12 tablet, Rfl: 0  No Known Allergies   Review of Systems Constitutional: -fever, -chills, -sweats, -unexpected weight change, -decreased appetite, -fatigue Allergy: -sneezing, -itching, -congestion Dermatology: -changing moles, --rash, -lumps ENT: -runny nose, -ear pain, -sore throat, -hoarseness, -sinus pain, -teeth pain, - ringing in ears, -hearing loss, -nosebleeds Cardiology: -chest pain, -palpitations, -swelling, -difficulty breathing when lying flat, -waking up short of breath Respiratory: -cough, -shortness of breath, -difficulty breathing with exercise or exertion, -wheezing, -coughing up blood Gastroenterology: -abdominal pain, -nausea, -vomiting, -diarrhea, -constipation, -blood in stool, -changes in bowel movement, -difficulty swallowing or eating Hematology: -bleeding, -bruising  Musculoskeletal: -joint aches, -muscle aches, -joint swelling, -back pain, -neck pain, -cramping, -changes in gait Ophthalmology: denies vision changes, eye redness, itching, discharge Urology: -burning with urination, -difficulty urinating, -blood in urine, -urinary frequency, -urgency, -incontinence Neurology: -headache, -weakness, -tingling, -numbness, -memory loss, -falls, -dizziness Psychology: -depressed mood, -agitation, -sleep problems Breast/gyn: -breast tenderness, -discharge, -lumps, -vaginal discharge,- irregular periods, -heavy periods      Objective:  BP 120/62   Pulse 70   Temp (!) 97.5 F (36.4 C)   Ht 5\' 2"  (1.575 m)   Wt 146 lb (66.2 kg)   SpO2 99%   BMI 26.70 kg/m   General appearance: alert, no distress, WD/WN, African American female Skin: scattered macules, no worrisome lesions HEENT: normocephalic, conjunctiva/corneas normal, sclerae anicteric, PERRLA, EOMi, nares patent, no discharge or erythema, pharynx normal Oral cavity: MMM, tongue normal, teeth normal Neck: supple, no lymphadenopathy, no thyromegaly, no masses, normal ROM, no bruits Chest: non tender, normal shape and expansion Heart: RRR, normal S1, S2, no murmurs Lungs: CTA bilaterally, no wheezes, rhonchi, or rales Abdomen: +bs, soft, non tender, non distended, no masses, no hepatomegaly, no splenomegaly, no bruits Back: non tender, normal ROM, no scoliosis Musculoskeletal: upper extremities non tender, no obvious deformity, normal ROM throughout, lower extremities non tender, no obvious deformity, normal ROM throughout Extremities: no edema, no cyanosis, no clubbing Pulses: 2+ symmetric,  upper and lower extremities, normal cap refill Neurological: alert, oriented x 3, CN2-12 intact, strength normal upper extremities and lower extremities, sensation normal throughout, DTRs 2+ throughout, no cerebellar signs, gait normal Psychiatric: normal affect, behavior normal, pleasant  Breast/gyn/rectal - deferred to gynecology     Assessment and Plan :   Encounter Diagnoses  Name Primary?  . Routine general medical examination at a health care facility Yes  . Essential hypertension, benign   . Low serum HDL   . Alkaline phosphatase elevation   . Vitamin D deficiency   . Prediabetes   . Need for influenza vaccination   . Mood change   . Acute stress reaction     Physical exam - discussed and counseled on healthy lifestyle, diet, exercise, preventative care, vaccinations, sick and well care, proper use of emergency dept and after hours care, and  addressed their concerns.    Health screening: Advised they see their eye doctor yearly for routine vision care. Advised they see their dentist yearly for routine dental care including hygiene visits twice yearly. See your gynecologist yearly for routine gynecological care.  Cancer screening Counseled on self breast exams, mammograms, cervical cancer screening F/u with gynecology  Call insruance above coverage for colonoscopy screen   Vaccinations: Advised yearly influenza vaccine Counseled on the influenza virus vaccine.  Vaccine information sheet given.  Influenza vaccine given after consent obtained.  Up to date on Td   Separate significant chronic issues discussed: Counseled on her mood, stress, career concerns.  advised counseling, seek advice from mentors.  Discuss further with husband  Labs as below today, c/t current medications  Sila was seen today for annual exam.  Diagnoses and all orders for this visit:  Routine general medical examination at a health care facility -     Comprehensive metabolic panel -     Lipid panel -     CBC -     Hemoglobin A1c -     TSH -     VITAMIN D 25 Hydroxy (Vit-D Deficiency, Fractures)  Essential hypertension, benign -     Comprehensive metabolic panel -     Lipid panel  Low serum HDL -     Lipid panel  Alkaline phosphatase elevation  Vitamin D deficiency  Prediabetes -     Hemoglobin A1c  Need for influenza vaccination -     Flu Vaccine QUAD 6+ mos PF IM (Fluarix Quad PF)  Mood change  Acute stress reaction   Follow-up pending labs, yearly for physical

## 2019-09-27 LAB — COMPREHENSIVE METABOLIC PANEL
ALT: 14 IU/L (ref 0–32)
AST: 19 IU/L (ref 0–40)
Albumin/Globulin Ratio: 1.3 (ref 1.2–2.2)
Albumin: 4.3 g/dL (ref 3.8–4.8)
Alkaline Phosphatase: 128 IU/L — ABNORMAL HIGH (ref 39–117)
BUN/Creatinine Ratio: 11 (ref 9–23)
BUN: 10 mg/dL (ref 6–24)
Bilirubin Total: 0.5 mg/dL (ref 0.0–1.2)
CO2: 25 mmol/L (ref 20–29)
Calcium: 9.6 mg/dL (ref 8.7–10.2)
Chloride: 100 mmol/L (ref 96–106)
Creatinine, Ser: 0.87 mg/dL (ref 0.57–1.00)
GFR calc Af Amer: 90 mL/min/{1.73_m2} (ref >59)
GFR calc non Af Amer: 78 mL/min/{1.73_m2} (ref >59)
Globulin, Total: 3.3 g/dL (ref 1.5–4.5)
Glucose: 107 mg/dL — ABNORMAL HIGH (ref 65–99)
Potassium: 3.8 mmol/L (ref 3.5–5.2)
Sodium: 139 mmol/L (ref 134–144)
Total Protein: 7.6 g/dL (ref 6.0–8.5)

## 2019-09-27 LAB — CBC
Hematocrit: 37.9 % (ref 34.0–46.6)
Hemoglobin: 12.7 g/dL (ref 11.1–15.9)
MCH: 28.9 pg (ref 26.6–33.0)
MCHC: 33.5 g/dL (ref 31.5–35.7)
MCV: 86 fL (ref 79–97)
Platelets: 360 10*3/uL (ref 150–450)
RBC: 4.39 x10E6/uL (ref 3.77–5.28)
RDW: 12.5 % (ref 11.7–15.4)
WBC: 5.5 10*3/uL (ref 3.4–10.8)

## 2019-09-27 LAB — TSH: TSH: 0.962 u[IU]/mL (ref 0.450–4.500)

## 2019-09-27 LAB — VITAMIN D 25 HYDROXY (VIT D DEFICIENCY, FRACTURES): Vit D, 25-Hydroxy: 38.6 ng/mL (ref 30.0–100.0)

## 2019-09-27 LAB — LIPID PANEL
Chol/HDL Ratio: 4.9 ratio — ABNORMAL HIGH (ref 0.0–4.4)
Cholesterol, Total: 153 mg/dL (ref 100–199)
HDL: 31 mg/dL — ABNORMAL LOW (ref >39)
LDL Chol Calc (NIH): 110 mg/dL — ABNORMAL HIGH (ref 0–99)
Triglycerides: 58 mg/dL (ref 0–149)
VLDL Cholesterol Cal: 12 mg/dL (ref 5–40)

## 2019-09-27 LAB — HEMOGLOBIN A1C
Est. average glucose Bld gHb Est-mCnc: 117 mg/dL
Hgb A1c MFr Bld: 5.7 % — ABNORMAL HIGH (ref 4.8–5.6)

## 2019-10-01 ENCOUNTER — Other Ambulatory Visit: Payer: Self-pay | Admitting: Medical

## 2019-10-01 ENCOUNTER — Encounter: Payer: Self-pay | Admitting: Medical

## 2019-10-01 MED ORDER — CVS D3 25 MCG (1000 UT) PO CAPS: 1000.0000 [IU] | ORAL_CAPSULE | Freq: Every day | ORAL | 3 refills | Status: DC

## 2019-10-01 MED ORDER — TELMISARTAN-HCTZ 40-12.5 MG PO TABS: 1.0000 | ORAL_TABLET | Freq: Every day | ORAL | 3 refills | Status: DC

## 2019-10-01 MED ORDER — ROSUVASTATIN CALCIUM 10 MG PO TABS: 10.0000 mg | ORAL_TABLET | Freq: Every day | ORAL | 3 refills | Status: DC

## 2019-10-03 NOTE — Telephone Encounter (Signed)
Get me the form

## 2019-10-04 NOTE — Telephone Encounter (Signed)
Forms has been filled out

## 2019-10-05 DIAGNOSIS — M25512 Pain in left shoulder: Secondary | ICD-10-CM | POA: Diagnosis not present

## 2019-10-11 DIAGNOSIS — M7542 Impingement syndrome of left shoulder: Secondary | ICD-10-CM | POA: Diagnosis not present

## 2019-11-28 ENCOUNTER — Other Ambulatory Visit: Payer: BC Managed Care – PPO

## 2019-12-13 ENCOUNTER — Telehealth: Payer: Self-pay

## 2019-12-13 NOTE — Telephone Encounter (Signed)
Received a [facility] from [facility] [address] for a refill on Pravastatin pt. Last apt. 09/26/19

## 2019-12-13 NOTE — Telephone Encounter (Signed)
Patient is no longer on Pravastatin as in his medication list he now takes Crestor.

## 2019-12-18 ENCOUNTER — Other Ambulatory Visit: Payer: Self-pay | Admitting: Medical

## 2020-01-04 ENCOUNTER — Telehealth: Payer: Self-pay | Admitting: Medical

## 2020-01-04 NOTE — Telephone Encounter (Signed)
Pt called and is wondering if she should take Omega 3 fish oil 1000 mg states she has felt mentally drained and sluggish feeling, states she works from home, and she is wondering if this would help her feel more, pt is not for sure if being at home is draining her, pt can be reached at [phone]

## 2020-01-07 NOTE — Telephone Encounter (Signed)
Message has been sent to patient via mychart  

## 2020-01-07 NOTE — Telephone Encounter (Signed)
I recommend a visit.  Looking back over her labs from her last visit, her lab results are okay.  If she is taking the listed medicines regularly, we may want to discuss some modification to that regimen in case this is contributing to her fatigue.   There can be a lot of reasons for feeling tired and fatigue, such as stress, not getting good sleep, not exercising, not eating healthy.   Hopefully she is still taking her vitamin D supplement and may be a multivitamin daily  Consider visit, particularly if she is compliant with metoprolol and cholesterol medicaiton which sometimes can contribute to fatigue

## 2020-01-09 ENCOUNTER — Other Ambulatory Visit: Payer: Self-pay

## 2020-01-09 ENCOUNTER — Encounter: Payer: Self-pay | Admitting: Medical

## 2020-01-09 ENCOUNTER — Ambulatory Visit (INDEPENDENT_AMBULATORY_CARE_PROVIDER_SITE_OTHER): Payer: BC Managed Care – PPO | Admitting: Medical

## 2020-01-09 VITALS — BP 110/72 | HR 64 | Temp 98.0°F | Ht 62.0 in | Wt 143.6 lb

## 2020-01-09 DIAGNOSIS — E559 Vitamin D deficiency, unspecified: Secondary | ICD-10-CM

## 2020-01-09 DIAGNOSIS — R41840 Attention and concentration deficit: Secondary | ICD-10-CM

## 2020-01-09 DIAGNOSIS — F419 Anxiety disorder, unspecified: Secondary | ICD-10-CM | POA: Diagnosis not present

## 2020-01-09 DIAGNOSIS — Z79899 Other long term (current) drug therapy: Secondary | ICD-10-CM

## 2020-01-09 DIAGNOSIS — F69 Unspecified disorder of adult personality and behavior: Secondary | ICD-10-CM

## 2020-01-09 DIAGNOSIS — R4586 Emotional lability: Secondary | ICD-10-CM

## 2020-01-09 DIAGNOSIS — I10 Essential (primary) hypertension: Secondary | ICD-10-CM

## 2020-01-09 DIAGNOSIS — F489 Nonpsychotic mental disorder, unspecified: Secondary | ICD-10-CM | POA: Insufficient documentation

## 2020-01-09 NOTE — Progress Notes (Signed)
Subjective: Chief Complaint  Patient presents with  . Fatigue    due to mental    Here for mental fog.   In recent weeks to months having difficulty with focus at work.  Works from home , is a Warden/ranger.  Lately seems to zone things out, not as sharp as in the past, sometimes doesn't see or catch things she should.   Her boss questioned her analysis from a recent meeting.   On the one hand she feels she is careful, catches things she should in meetings and data, but then other time not.     Despite buying healthy foods, she will lately has not been eating healthy.  She tries to go walk for exercise but is busy thinking about emails and other issues work related.  She does not really get to unwind with exercise.  Having trouble unwinding and relaxing in general.  She had questions about using fish oil to help with memory.  She has been trying to do some podcast about intentional living.  Sleep is not not always the best.  For a while there she was waking up about 4 AM every morning with a lot on her mind.  She and her husband are both working from home remotely.  Her kids finally got to go back to school.  I have not been on vacation in over a year.  She works 45 to 50 hours/week in general.  feels like she is working 80 hours per week.  Recently did 60 hour /wk x 2 weeks, but on average probably   Having trouble lately with critical thinking.    Wonders if it is her cognition, or the stress of the job.  Did well in high school and college.  Has PhD.  Is overqualified for her job.  She gets bored or zones out in meetings at work.  Meetings can be 1 hour or over 2 hours.  Seems to be tuning people out.  Feeling down and depressed sometimes.   Sleeping less than usual.    Not having pleasure in things she normally enjoys.  Sometimes she comes home and watches tv and play candy crush.  Started therapy 2 weeks ago, has had 2 visits.   Her therapist thinks she needs to pursue another job to fulfill  her potential and expertise.    Past Medical History:  Diagnosis Date  . Alkaline phosphatase elevation 2019   isoenzymes normal, presumed related to vit D deficiency  . Blood transfusion without reported diagnosis    with prior gynecology surgery  . Hyperlipidemia   . Hypertension 2010  . Impaired fasting blood sugar 2019  . Wears glasses    Current Outpatient Medications on File Prior to Visit  Medication Sig Dispense Refill  . Cholecalciferol ([facility] D3) 25 MCG (1000 UT) capsule Take 1 capsule (1,000 Units total) by mouth daily. 90 capsule 3  . LORazepam (ATIVAN) 0.5 MG tablet Take 1 tablet (0.5 mg total) by mouth 2 (two) times daily as needed for anxiety. 30 tablet 0  . metoprolol succinate (TOPROL-XL) 25 MG 24 hr tablet TAKE 1 TABLET BY MOUTH EVERY DAY 90 tablet 3  . telmisartan-hydrochlorothiazide (MICARDIS HCT) 40-12.5 MG tablet Take 1 tablet by mouth daily. 90 tablet 3   No current facility-administered medications on file prior to visit.   ROS as in subjective    Objective: BP 110/72   Pulse 64   Temp 98 F (36.7 C)   Ht 5\' 2"  (  1.575 m)   Wt 143 lb 9.6 oz (65.1 kg)   SpO2 98%   BMI 26.26 kg/m   Gen: wd, wn, nad Psych: pleasant, seems a little frustrated, answers questions appropriately Neuro: nonfocal, CN2-12 intact, normal exam  Depression screen [facility] 2/9 01/09/2020 09/26/2019 03/30/2019 05/17/2018 01/17/2017  Decreased Interest 3 1 1  0 1  Down, Depressed, Hopeless 1 1 1 1  0  PHQ - 2 Score 4 2 2 1 1   Altered sleeping 1 0 2 0 -  Tired, decreased energy 1 1 2 1  -  Change in appetite 3 0 3 0 -  Feeling bad or failure about yourself  3 0 3 1 -  Trouble concentrating 2 0 0 0 -  Moving slowly or fidgety/restless 0 0 0 0 -  Suicidal thoughts 0 0 0 0 -  PHQ-9 Score 14 3 12 3  -  Difficult doing work/chores Very difficult Not difficult at all - - -     Assessment: Encounter Diagnoses  Name Primary?  . Attention and concentration deficit Yes  . Mental and behavioral  problem   . Vitamin D deficiency   . Anxiety   . Mood change   . Essential hypertension, benign   . High risk medication use       Plan: Discussed concerns and symptoms.  Reviewed 08/2019 labs.  Reviewed medications.   Statin has potential for mental fog.   I will have her stop the statin for 2-4 weeks and see if this makes a difference.  Advised she c/t her other medications, including vit D supplement, consider multivitamin.  She wants to try OTC fish oil supplement as well.  discussed importance of exercise, good sleep habits, healthy diet.   Discussed stress reduction, relaxation techniques, listening to encouraging music or podcasts.   C/t counseling.  F/u 3-4 weeks.

## 2020-03-27 DIAGNOSIS — Z01419 Encounter for gynecological examination (general) (routine) without abnormal findings: Secondary | ICD-10-CM | POA: Diagnosis not present

## 2020-03-27 DIAGNOSIS — Z124 Encounter for screening for malignant neoplasm of cervix: Secondary | ICD-10-CM | POA: Diagnosis not present

## 2020-03-27 DIAGNOSIS — Z1239 Encounter for other screening for malignant neoplasm of breast: Secondary | ICD-10-CM | POA: Diagnosis not present

## 2020-03-27 DIAGNOSIS — Z1211 Encounter for screening for malignant neoplasm of colon: Secondary | ICD-10-CM | POA: Diagnosis not present

## 2020-03-27 LAB — HM PAP SMEAR
HM Pap smear: NEGATIVE
HM Pap smear: NEGATIVE

## 2020-03-27 LAB — HM MAMMOGRAPHY

## 2020-03-27 LAB — RESULTS CONSOLE HPV: CHL HPV: NEGATIVE

## 2020-04-01 DIAGNOSIS — L218 Other seborrheic dermatitis: Secondary | ICD-10-CM | POA: Diagnosis not present

## 2020-04-17 DIAGNOSIS — Z1211 Encounter for screening for malignant neoplasm of colon: Secondary | ICD-10-CM | POA: Diagnosis not present

## 2020-05-30 DIAGNOSIS — L718 Other rosacea: Secondary | ICD-10-CM | POA: Diagnosis not present

## 2020-09-26 ENCOUNTER — Encounter: Payer: BC Managed Care – PPO | Admitting: Medical

## 2020-10-02 ENCOUNTER — Other Ambulatory Visit: Payer: Self-pay | Admitting: Medical

## 2020-10-30 ENCOUNTER — Other Ambulatory Visit: Payer: Self-pay | Admitting: Medical

## 2020-11-26 ENCOUNTER — Encounter: Payer: Self-pay | Admitting: Medical

## 2020-12-02 ENCOUNTER — Other Ambulatory Visit: Payer: Self-pay | Admitting: Medical

## 2020-12-12 ENCOUNTER — Encounter: Payer: Self-pay | Admitting: Medical

## 2020-12-12 ENCOUNTER — Other Ambulatory Visit: Payer: Self-pay

## 2020-12-12 ENCOUNTER — Telehealth: Payer: 59 | Admitting: Medical

## 2020-12-12 VITALS — Ht 62.0 in | Wt 149.0 lb

## 2020-12-12 DIAGNOSIS — R059 Cough, unspecified: Secondary | ICD-10-CM | POA: Diagnosis not present

## 2020-12-12 DIAGNOSIS — R058 Other specified cough: Secondary | ICD-10-CM | POA: Diagnosis not present

## 2020-12-12 MED ORDER — BENZONATATE 200 MG PO CAPS: 200.0000 mg | ORAL_CAPSULE | Freq: Three times a day (TID) | ORAL | 0 refills | Status: DC | PRN

## 2020-12-12 MED ORDER — PREDNISONE 20 MG PO TABS
40.0000 mg | ORAL_TABLET | Freq: Every day | ORAL | 0 refills | Status: DC
Start: 2020-12-12 — End: 2021-01-02

## 2020-12-12 NOTE — Progress Notes (Addendum)
Subjective:     Patient ID: Anne Bender, female   DOB: 28-May-1971, 50 y.o.   MRN: [id number]  This visit type was conducted due to national recommendations for restrictions regarding the COVID-19 Pandemic (e.g. social distancing) in an effort to limit this patient's exposure and mitigate transmission in our community.  Due to their co-morbid illnesses, this patient is at least at moderate risk for complications without adequate follow up.  This format is felt to be most appropriate for this patient at this time.    Documentation for virtual audio and video telecommunications through [employer] encounter:  The patient was located at home. The provider was located in the office. The patient did consent to this visit and is aware of possible charges through their insurance for this visit.  The other persons participating in this telemedicine service were none. Time spent on call was 16 minutes and in review of previous records 20 minutes total.  This virtual service is not related to other E/M service within previous 7 days.   HPI Chief Complaint  Patient presents with  . Cough    Cough started 11/23/20. Has not been tested for covid. Had 2 vaccines    Virtual consult for cough.  Her symptoms began the week of Christmas.  Her whole household got sick including husband and son.  They have all resolved.  She had cold symptoms that week including congestion, cough, sore throat, general malaise but all of those symptoms cleared up except the cough.  So now she is a few weeks out with lingering cough periodically.  No wheezing, no shortness of breath, no hemoptysis, no fever, no body aches, no chills, no nausea vomiting diarrhea, no sinus pressure.  No night sweats.  She gets occasional phlegm.  Using Coricidin HBP over-the-counter and Ricola drops.  No other aggravating or relieving factors. No other complaint.  Has had the covid vaccine.  No other aggravating or relieving factors. No other  complaint.   Past Medical History:  Diagnosis Date  . Alkaline phosphatase elevation 2019   isoenzymes normal, presumed related to vit D deficiency  . Blood transfusion without reported diagnosis    with prior gynecology surgery  . Hyperlipidemia   . Hypertension 2010  . Impaired fasting blood sugar 2019  . Wears glasses    Current Outpatient Medications on File Prior to Visit  Medication Sig Dispense Refill  . [facility] D3 25 MCG (1000 UT) capsule TAKE 1 CAPSULE BY MOUTH EVERY DAY 90 capsule 1  . metoprolol succinate (TOPROL-XL) 25 MG 24 hr tablet TAKE 1 TABLET BY MOUTH EVERY DAY 90 tablet 0  . telmisartan-hydrochlorothiazide (MICARDIS HCT) 40-12.5 MG tablet TAKE 1 TABLET BY MOUTH EVERY DAY 90 tablet 0  . LORazepam (ATIVAN) 0.5 MG tablet Take 1 tablet (0.5 mg total) by mouth 2 (two) times daily as needed for anxiety. (Patient not taking: Reported on 12/12/2020) 30 tablet 0   No current facility-administered medications on file prior to visit.     Review of Systems As in subjective    Objective:   Physical Exam Due to coronavirus pandemic stay at home measures, patient visit was virtual and they were not examined in person.   Ht 5\' 2"  (1.575 m)   Wt 149 lb (67.6 kg)   BMI 27.25 kg/m   Gen: wd, wn, nad, well appearing No obvious sob or wheezing Answers questions in complete sentences        Assessment:     Encounter Diagnoses  Name  Primary?  . Cough Yes  . Post-viral cough syndrome        Plan:     Symptoms suggest recent cold or possible mild COVID infection given the spread in the community.  She is over the significant symptoms and if she did have COVID she has done her quarantine.  She did not get a test.  She has been vaccinated for COVID  At this point advised continued good hydration, begin round of medications below.  If the cough is not resolving over the next week then let us get a chest x-ray.  She agrees with plan and recommendations  This virtual  visit began with video but was interrupted and remainder had to be done by phone.  Less than 50% of time was completed on the video.  Anne Bender was seen today for cough.  Diagnoses and all orders for this visit:  Cough  Post-viral cough syndrome  Other orders -     predniSONE (DELTASONE) 20 MG tablet; Take 2 tablets (40 mg total) by mouth daily with breakfast. -     benzonatate (TESSALON) 200 MG capsule; Take 1 capsule (200 mg total) by mouth 3 (three) times daily as needed for cough.  f/u prn

## 2021-01-01 ENCOUNTER — Other Ambulatory Visit: Payer: Self-pay | Admitting: Medical

## 2021-01-01 ENCOUNTER — Ambulatory Visit
Admission: RE | Admit: 2021-01-01 | Discharge: 2021-01-01 | Disposition: A | Discharge status: Home / Self Care | Payer: 59 | Source: Ambulatory Visit | Attending: Medical | Admitting: Medical

## 2021-01-01 ENCOUNTER — Telehealth: Payer: Self-pay | Admitting: Medical

## 2021-01-01 DIAGNOSIS — R059 Cough, unspecified: Secondary | ICD-10-CM

## 2021-01-01 NOTE — Telephone Encounter (Signed)
Please go to [facility] for your chest xray.   Their hours are 8am - 4:30 pm Monday - Friday.  Take your insurance card with you.  [facility] [phone]  [address], [address] [place], [place] [address]  [address] [place], [place] [address]    Alternately she can go to [facility] between 8 and 4:30 for chest x-ray

## 2021-01-01 NOTE — Telephone Encounter (Signed)
Called and informed pt.  

## 2021-01-01 NOTE — Telephone Encounter (Signed)
Pt called and states that she is still having a really bad  Cough, states she has taking all the steroids and cough pearls that was given to her, states you had mentioned a chest xray,   [id number] [place] [place] - [address] AT [address] [facility] [address]

## 2021-01-01 NOTE — Progress Notes (Signed)
Dg  

## 2021-01-02 ENCOUNTER — Other Ambulatory Visit: Payer: Self-pay | Admitting: Medical

## 2021-01-02 MED ORDER — HYDROCOD POLST-CPM POLST ER 10-8 MG/5ML PO SUER
5.0000 mL | Freq: Two times a day (BID) | ORAL | 0 refills | Status: DC
Start: 2021-01-02 — End: 2021-01-21

## 2021-01-02 MED ORDER — TELMISARTAN-HCTZ 40-12.5 MG PO TABS
1.0000 | ORAL_TABLET | Freq: Every day | ORAL | 0 refills | Status: DC
Start: 2021-01-02 — End: 2021-04-14

## 2021-01-21 ENCOUNTER — Other Ambulatory Visit: Payer: Self-pay

## 2021-01-21 ENCOUNTER — Encounter: Payer: Self-pay | Admitting: Family Medicine

## 2021-01-21 ENCOUNTER — Ambulatory Visit: Payer: 59 | Admitting: Family Medicine

## 2021-01-21 VITALS — BP 118/80 | HR 72 | Temp 98.2°F | Ht 62.0 in | Wt 157.6 lb

## 2021-01-21 DIAGNOSIS — L0201 Cutaneous abscess of face: Secondary | ICD-10-CM

## 2021-01-21 NOTE — Progress Notes (Signed)
Chief Complaint  Patient presents with  . Infection    Did telehealth through [employer] last Friday for infection on her nose, given prednisone pak and mupirocon ointment 2%.  Went to [facility] last night and was seen and was switched to doxy 100 BID last night. Woke up this morning and infection has worsened and the pain has worsened as well.    Started Tuesday 2/15, like a slight pimple on the tip of her nose, by Thursday it was swollen, thought it was a pimple, tried to squeeze it. Nothing came out  By Friday, the pain was worse, more swollen, did telehealth visit, prescribed the prednisone and mupirocin.   It continued to get worse, so went to [facility] last night. Stopped prednisone yesterday, started the doxycyline last night.  She has had 2 doses so far.  This morning, it looked worse and hurt more. Denies fevers. For pain she took 400mg  ibuprofen, it helped, but it is now wearing off (4 hours) She was prescribed lidocaine topically, but wasn't available at pharmacy yet.  PMH, PSH, SH reviewed  Outpatient Encounter Medications as of 01/21/2021  Medication Sig Note  . [facility] D3 25 MCG (1000 UT) capsule TAKE 1 CAPSULE BY MOUTH EVERY DAY   . doxycycline (VIBRAMYCIN) 100 MG capsule Take 100 mg by mouth 2 (two) times daily.   . metoprolol succinate (TOPROL-XL) 25 MG 24 hr tablet TAKE 1 TABLET BY MOUTH EVERY DAY   . telmisartan-hydrochlorothiazide (MICARDIS HCT) 40-12.5 MG tablet Take 1 tablet by mouth daily.   01/23/2021 lidocaine (XYLOCAINE) 2 % solution As needed 4-5 times a day to nasal mucosa (Patient not taking: Reported on 01/21/2021) 01/21/2021: [facility] is ordering  . methylPREDNISolone (MEDROL DOSEPAK) 4 MG TBPK tablet Take by mouth daily. (Patient not taking: Reported on 01/21/2021)   . mupirocin ointment (BACTROBAN) 2 % PLEASE SEE ATTACHED FOR DETAILED DIRECTIONS (Patient not taking: Reported on 01/21/2021)   . [DISCONTINUED] chlorpheniramine-HYDROcodone (TUSSIONEX PENNKINETIC ER) 10-8 MG/5ML SUER Take 5 mLs  by mouth 2 (two) times daily.   . [DISCONTINUED] LORazepam (ATIVAN) 0.5 MG tablet Take 1 tablet (0.5 mg total) by mouth 2 (two) times daily as needed for anxiety. (Patient not taking: Reported on 12/12/2020)    No facility-administered encounter medications on file as of 01/21/2021.   No Known Allergies  ROS: no fever or chills.  No nausea or vomiting. No headaches, dizziness, URI symptoms. No eye pain, scalp pain, ear pain. No other skin concerns, rashes, just lesion on nose   PHYSICAL EXAM:  BP 118/80   Pulse 72   Temp 98.2 F (36.8 C) (Tympanic)   Ht 5\' 2"  (1.575 m)   Wt 157 lb 9.6 oz (71.5 kg)   BMI 28.83 kg/m   Pleasant female, in good spirits, in mild discomfort. HEENT:  Tip of nose is scabbed (inferiorly) with yellow crusting.  There is soft tissue swelling at the tip of the nose and mild erythema (pink just at tip and slightly proximal).  Remainder of facial skin is normal. Neck: no lymphadenopathy  ASSESSMENT/PLAN:  Cutaneous abscess of face - +/- impetigo. Doxy is a good choice, just needs more time. Cont doxy, warm compresses. f/u if persists/worsening (ie cellulitis, fever, more swelling/pain) - Plan: WOUND CULTURE   Wound culture sent (one area unroofed and drained some).  Possible false negative given already had 2 doses of ABX) Continue doxy Pain control discussed--increase ibuprofen to 800mg  TID, if needed, plus tylenol in between (if needed) Warm compresses Reviewed s/sx  for which she should follow up or seek immediate care.

## 2021-01-21 NOTE — Patient Instructions (Signed)
Continue the oral antibiotic (doxyxycline) twice daily. I recommend moist warm compresses to the nose--this will help with the scabbing, and potentially encourage drainage (which will help it feel much better)  You may increase your ibuprofen dose to 800mg  every 8 hours (4 pills, three times daily, with food). Decrease the dose if it bothers your stomach. You may use tylenol in between doses of ibuprofen, if needed for pain control.  If you notice increasing redness on the nose (darker red, and/or spreading up the nose), and/or any fever, this is a sign that the antibiotics are not working, and we need to get more aggressive in treating the infection.

## 2021-01-27 LAB — WOUND CULTURE: Organism ID, Bacteria: NONE SEEN

## 2021-01-30 ENCOUNTER — Other Ambulatory Visit: Payer: Self-pay | Admitting: Medical

## 2021-02-25 ENCOUNTER — Other Ambulatory Visit: Payer: Self-pay | Admitting: Medical

## 2021-04-04 ENCOUNTER — Other Ambulatory Visit: Payer: Self-pay | Admitting: Medical

## 2021-04-14 ENCOUNTER — Other Ambulatory Visit: Payer: Self-pay | Admitting: Medical

## 2021-05-06 ENCOUNTER — Ambulatory Visit (INDEPENDENT_AMBULATORY_CARE_PROVIDER_SITE_OTHER): Payer: 59 | Admitting: Medical

## 2021-05-06 ENCOUNTER — Encounter: Payer: Self-pay | Admitting: Medical

## 2021-05-06 ENCOUNTER — Other Ambulatory Visit: Payer: Self-pay

## 2021-05-06 VITALS — BP 130/74 | HR 71 | Ht 62.0 in | Wt 157.4 lb

## 2021-05-06 DIAGNOSIS — R5383 Other fatigue: Secondary | ICD-10-CM

## 2021-05-06 DIAGNOSIS — Z7185 Encounter for immunization safety counseling: Secondary | ICD-10-CM

## 2021-05-06 DIAGNOSIS — Z79899 Other long term (current) drug therapy: Secondary | ICD-10-CM | POA: Diagnosis not present

## 2021-05-06 DIAGNOSIS — R4586 Emotional lability: Secondary | ICD-10-CM

## 2021-05-06 DIAGNOSIS — E559 Vitamin D deficiency, unspecified: Secondary | ICD-10-CM | POA: Diagnosis not present

## 2021-05-06 DIAGNOSIS — R7301 Impaired fasting glucose: Secondary | ICD-10-CM | POA: Diagnosis not present

## 2021-05-06 DIAGNOSIS — R7303 Prediabetes: Secondary | ICD-10-CM

## 2021-05-06 DIAGNOSIS — Z0000 Encounter for general adult medical examination without abnormal findings: Secondary | ICD-10-CM

## 2021-05-06 DIAGNOSIS — G479 Sleep disorder, unspecified: Secondary | ICD-10-CM

## 2021-05-06 DIAGNOSIS — R0683 Snoring: Secondary | ICD-10-CM

## 2021-05-06 DIAGNOSIS — I10 Essential (primary) hypertension: Secondary | ICD-10-CM

## 2021-05-06 DIAGNOSIS — R748 Abnormal levels of other serum enzymes: Secondary | ICD-10-CM

## 2021-05-06 LAB — POCT URINALYSIS DIP (PROADVANTAGE DEVICE)
Bilirubin, UA: NEGATIVE
Glucose, UA: NEGATIVE mg/dL
Ketones, POC UA: NEGATIVE mg/dL
Leukocytes, UA: NEGATIVE
Nitrite, UA: NEGATIVE
Specific Gravity, Urine: 1.02
Urobilinogen, Ur: 0.2
pH, UA: 6 (ref 5.0–8.0)

## 2021-05-06 NOTE — Patient Instructions (Signed)
This visit was a preventative care visit, also known as wellness visit or routine physical.   Topics typically include healthy lifestyle, diet, exercise, preventative care, vaccinations, sick and well care, proper use of emergency dept and after hours care, as well as other concerns.     Recommendations: Continue to return yearly for your annual wellness and preventative care visits.  This gives [place] a chance to discuss healthy lifestyle, exercise, vaccinations, review your chart record, and perform screenings where appropriate.  I recommend you see your eye doctor yearly for routine vision care.  I recommend you see your dentist yearly for routine dental care including hygiene visits twice yearly.   Vaccination recommendations were reviewed Immunization History  Administered Date(s) Administered  . Influenza,inj,Quad PF,6+ Mos 08/09/2017, 08/17/2018, 09/26/2019  . PFIZER(Purple Top)SARS-COV-2 Vaccination 12/30/2019, 02/15/2020, 03/07/2020  . Td 12/14/2011    You will be due for tetanus booster next year.   Shingles vaccine:  I recommend you have a shingles vaccine to help prevent shingles or herpes zoster outbreak.   Please call your insurer to inquire about coverage for the Shingrix vaccine given in 2 doses.   Some insurers cover this vaccine after age 61, some cover this after age 32.  If your insurer covers this, then call to schedule appointment to have this vaccine here.     Screening for cancer: Colon cancer screening: She will call Dr. Loreta Ave to get back on schedule for first screening colonoscopy  Please call your insurance company to check coverage for colon cancer screening.  Options may include Cologard stool test or Colonoscopy.  You should also inquire about which facility the colonoscopy could be performed, and coverage for diagnostic vs screening colonoscopy as coverage may vary.  If you have significant family history of colon cancer or blood in the stool, then you should  only do the colonoscopy, not the cologard test.  We will try to obtain copy of last mammogram and pap from 2021  Breast cancer screening: You should perform a self breast exam monthly.   We reviewed recommendations for regular mammograms and breast cancer screening.  Cervical cancer screening: We reviewed recommendations for pap smear screening.   Skin cancer screening: Check your skin regularly for new changes, growing lesions, or other lesions of concern Come in for evaluation if you have skin lesions of concern.  Lung cancer screening: If you have a greater than 20 pack year history of tobacco use, then you may qualify for lung cancer screening with a chest CT scan.   Please call your insurance company to inquire about coverage for this test.  We currently don't have screenings for other cancers besides breast, cervical, colon, and lung cancers.  If you have a strong family history of cancer or have other cancer screening concerns, please let me know.    Bone health: Get at least 150 minutes of aerobic exercise weekly Get weight bearing exercise at least once weekly Bone density test:   A bone density test is an imaging test that uses a type of X-ray to measure the amount of calcium and other minerals in your bones.  The test may be used to diagnose or screen you for a condition that causes weak or thin bones (osteoporosis), predict your risk for a broken bone (fracture), or determine how well your osteoporosis treatment is working. The bone density test is recommended for females 65 and older, or females or males <65 if certain risk factors such as thyroid disease, long term  use of steroids such as for asthma or rheumatological issues, vitamin D deficiency, estrogen deficiency, family history of osteoporosis, self or family history of fragility fracture in first degree relative.    Heart health: Get at least 150 minutes of aerobic exercise weekly Limit alcohol It is important  to maintain a healthy blood pressure and healthy cholesterol numbers  Heart disease screening: Screening for heart disease includes screening for blood pressure, fasting lipids, glucose/diabetes screening, BMI height to weight ratio, reviewed of smoking status, physical activity, and diet.    Goals include blood pressure 120/80 or less, maintaining a healthy lipid/cholesterol profile, preventing diabetes or keeping diabetes numbers under good control, not smoking or using tobacco products, exercising most days per week or at least 150 minutes per week of exercise, and eating healthy variety of fruits and vegetables, healthy oils, and avoiding unhealthy food choices like fried food, fast food, high sugar and high cholesterol foods.    Other tests may possibly include EKG test, CT coronary calcium score, echocardiogram, exercise treadmill stress test.    Medical care options: I recommend you continue to seek care here first for routine care.  We try really hard to have available appointments Monday through Friday daytime hours for sick visits, acute visits, and physicals.  Urgent care should be used for after hours and weekends for significant issues that cannot wait till the next day.  The emergency department should be used for significant potentially life-threatening emergencies.  The emergency department is expensive, can often have long wait times for less significant concerns, so try to utilize primary care, urgent care, or telemedicine when possible to avoid unnecessary trips to the emergency department.  Virtual visits and telemedicine have been introduced since the pandemic started in 2020, and can be convenient ways to receive medical care.  We offer virtual appointments as well to assist you in a variety of options to seek medical care.    Separate significant issues discussed: History of alkaline phosphatase elevation, currently on vitamin D-repeat labs today  Vitamin D  deficiency-continue supplement.  Eat fish regularly, get some sun exposure at least 15 minutes a day  Hypertension-continue current medication  Prediabetes, impaired glucose- updated lab screen today.  Avoid high carbohydrate diet, junk food, exercise regularly  Ongoing fatigue, mood changes, sleep disturbance-clinically several related factors causing this.  I recommend you consider counseling  I recommend a sleep study to evaluate sleep disturbance and to screen for sleep apnea  Exercise most days per week  Make sure you take time for yourself, but also spend time with people for social contact  Work on sleep hygiene

## 2021-05-06 NOTE — Progress Notes (Signed)
Subjective:   HPI  Anne Bender is a 50 y.o. female who presents for Chief Complaint  Patient presents with  . Annual Exam    Physical with fasting labs     Patient Care Team: Taelor Waymire, Kermit Balo, PA-C as PCP - General (Family Medicine) Sees dentist Sees eye doctor Dr. Charna Elizabeth, GI Gynecology, Dr. Debby Bud  Concerns: She notes ongoing concerns about fatigue, tends to stress eat or overeat.  Lately has felt down somewhat depressed.  Her current job has her working from home so she has less socialization.  This has been both a blessing and a curse.  She does feel extremely tired all the time.  Hypertension-compliant with medication  She does snore, there has been other folks complain about her snoring.  She has problems with sleep.  Sometimes goes to bed early but wakes up early in the morning and cannot get back to sleep.  She is not currently taking statin.  She had a side effect from prior statin so not taking any now.  She saw Dr. Loreta Ave last year GI for colonoscopy but never had the procedure yet due to scheduling conflict  No prior sleep study   Reviewed their medical, surgical, family, social, medication, and allergy history and updated chart as appropriate.  Past Medical History:  Diagnosis Date  . Alkaline phosphatase elevation 2019   isoenzymes normal, presumed related to vit D deficiency  . Blood transfusion without reported diagnosis    with prior gynecology surgery  . Hyperlipidemia   . Hypertension 2010  . Impaired fasting blood sugar 2019  . Wears glasses     Family History  Problem Relation Age of Onset  . Stroke Mother 97  . Hypertension Mother   . Heart disease Mother 59       pacemaker, CHF  . Pulmonary embolism Father   . Hypertension Father   . Peripheral vascular disease Father   . Hypertension Sister   . Hypertension Brother   . Diabetes Paternal Grandmother   . Hypertension Paternal Grandmother   . Cancer Paternal Grandmother         breast  . Cancer Maternal Aunt        breast  . Hypertension Brother      Current Outpatient Medications:  .  [employer] D3 25 MCG (1000 UT) capsule, TAKE 1 CAPSULE BY MOUTH EVERY DAY, Disp: 90 capsule, Rfl: 1 .  metoprolol succinate (TOPROL-XL) 25 MG 24 hr tablet, TAKE 1 TABLET BY MOUTH EVERY DAY, Disp: 30 tablet, Rfl: 0 .  telmisartan-hydrochlorothiazide (MICARDIS HCT) 40-12.5 MG tablet, TAKE 1 TABLET BY MOUTH EVERY DAY, Disp: 90 tablet, Rfl: 0  No Known Allergies    Review of Systems Constitutional: -fever, -chills, -sweats, -unexpected weight change, -decreased appetite, -fatigue Allergy: -sneezing, -itching, -congestion Dermatology: -changing moles, --rash, -lumps ENT: -runny nose, -ear pain, -sore throat, -hoarseness, -sinus pain, -teeth pain, - ringing in ears, -hearing loss, -nosebleeds Cardiology: -chest pain, -palpitations, -swelling, -difficulty breathing when lying flat, -waking up short of breath Respiratory: -cough, -shortness of breath, -difficulty breathing with exercise or exertion, -wheezing, -coughing up blood Gastroenterology: -abdominal pain, -nausea, -vomiting, -diarrhea, -constipation, -blood in stool, -changes in bowel movement, -difficulty swallowing or eating Hematology: -bleeding, -bruising  Musculoskeletal: -joint aches, -muscle aches, -joint swelling, -back pain, -neck pain, -cramping, -changes in gait Ophthalmology: denies vision changes, eye redness, itching, discharge Urology: -burning with urination, -difficulty urinating, -blood in urine, -urinary frequency, -urgency, -incontinence Neurology: -headache, -weakness, -tingling, -numbness, -memory loss, -falls, -  dizziness Psychology: -depressed mood, -agitation, -sleep problems Breast/gyn: -breast tendnerss, -discharge, -lumps, -vaginal discharge,- irregular periods, -heavy periods   Depression screen [facility] 2/9 05/06/2021 01/09/2020 09/26/2019 03/30/2019 05/17/2018  Decreased Interest 1 3 1 1  0  Down, Depressed,  Hopeless 1 1 1 1 1   PHQ - 2 Score 2 4 2 2 1   Altered sleeping 1 1 0 2 0  Tired, decreased energy 1 1 1 2 1   Change in appetite 1 3 0 3 0  Feeling bad or failure about yourself  0 3 0 3 1  Trouble concentrating 0 2 0 0 0  Moving slowly or fidgety/restless 0 0 0 0 0  Suicidal thoughts 0 0 0 0 0  PHQ-9 Score 5 14 3 12 3   Difficult doing work/chores Somewhat difficult Very difficult Not difficult at all - -       Objective:  BP 130/74   Pulse 71   Ht 5\' 2"  (1.575 m)   Wt 157 lb 6.4 oz (71.4 kg)   SpO2 97%   BMI 28.79 kg/m   General appearance: alert, no distress, WD/WN, African American female Skin: unremarkable HEENT: normocephalic, conjunctiva/corneas normal, sclerae anicteric, PERRLA, EOMi, nares patent, no discharge or erythema, pharynx normal Neck: supple, no lymphadenopathy, no thyromegaly, no masses, normal ROM, no bruits Chest: non tender, normal shape and expansion Heart: RRR, normal S1, S2, no murmurs Lungs: CTA bilaterally, no wheezes, rhonchi, or rales Abdomen: +bs, soft, non tender, non distended, no masses, no hepatomegaly, no splenomegaly, no bruits Back: non tender, normal ROM, no scoliosis Musculoskeletal: upper extremities non tender, no obvious deformity, normal ROM throughout, lower extremities non tender, no obvious deformity, normal ROM throughout Extremities: no edema, no cyanosis, no clubbing Pulses: 2+ symmetric, upper and lower extremities, normal cap refill Neurological: alert, oriented x 3, CN2-12 intact, strength normal upper extremities and lower extremities, sensation normal throughout, DTRs 2+ throughout, no cerebellar signs, gait normal Psychiatric: normal affect, behavior normal, pleasant  Breast/gyn/rectal - deferred to gynecology    Adult ECG Report  Indication: fatigue, HTN  Rate: 68 bpm  Rhythm: normal sinus rhythm  QRS Axis: 71 degrees  PR Interval:  QRS Duration: 2ms  QTc:  Conduction Disturbances: none  Other  Abnormalities: none  Patient's cardiac risk factors are: hypertension.  EKG comparison: 2019 EKG  Narrative Interpretation: no acute changes      Assessment and Plan :   Encounter Diagnoses  Name Primary?  . Routine general medical examination at a health care facility Yes  . Prediabetes   . Impaired fasting blood sugar   . Vitamin D deficiency   . High risk medication use   . Essential hypertension, benign   . Fatigue, unspecified type   . Alkaline phosphatase elevation   . Sleep disturbance   . Vaccine counseling   . Snoring   . Mood change      This visit was a preventative care visit, also known as wellness visit or routine physical.   Topics typically include healthy lifestyle, diet, exercise, preventative care, vaccinations, sick and well care, proper use of emergency dept and after hours care, as well as other concerns.     Recommendations: Continue to return yearly for your annual wellness and preventative care visits.  This gives a chance to discuss healthy lifestyle, exercise, vaccinations, review your chart record, and perform screenings where appropriate.  I recommend you see your eye doctor yearly for routine vision care.  I recommend you see your  dentist yearly for routine dental care including hygiene visits twice yearly.   Vaccination recommendations were reviewed Immunization History  Administered Date(s) Administered  . Influenza,inj,Quad PF,6+ Mos 08/09/2017, 08/17/2018, 09/26/2019  . PFIZER(Purple Top)SARS-COV-2 Vaccination 12/30/2019, 02/15/2020, 03/07/2020  . Td 12/14/2011    You will be due for tetanus booster next year.   Shingles vaccine:  I recommend you have a shingles vaccine to help prevent shingles or herpes zoster outbreak.   Please call your insurer to inquire about coverage for the Shingrix vaccine given in 2 doses.   Some insurers cover this vaccine after age 47, some cover this after age 85.  If your insurer covers this, then call  to schedule appointment to have this vaccine here.     Screening for cancer: Colon cancer screening: She will call Dr. Loreta Ave to get back on schedule for first screening colonoscopy  Please call your insurance company to check coverage for colon cancer screening.  Options may include Cologard stool test or Colonoscopy.  You should also inquire about which facility the colonoscopy could be performed, and coverage for diagnostic vs screening colonoscopy as coverage may vary.  If you have significant family history of colon cancer or blood in the stool, then you should only do the colonoscopy, not the cologard test.  We will try to obtain copy of last mammogram and pap from 2021  Breast cancer screening: You should perform a self breast exam monthly.   We reviewed recommendations for regular mammograms and breast cancer screening.  Cervical cancer screening: We reviewed recommendations for pap smear screening.   Skin cancer screening: Check your skin regularly for new changes, growing lesions, or other lesions of concern Come in for evaluation if you have skin lesions of concern.  Lung cancer screening: If you have a greater than 20 pack year history of tobacco use, then you may qualify for lung cancer screening with a chest CT scan.   Please call your insurance company to inquire about coverage for this test.  We currently don't have screenings for other cancers besides breast, cervical, colon, and lung cancers.  If you have a strong family history of cancer or have other cancer screening concerns, please let me know.    Bone health: Get at least 150 minutes of aerobic exercise weekly Get weight bearing exercise at least once weekly Bone density test:   A bone density test is an imaging test that uses a type of X-ray to measure the amount of calcium and other minerals in your bones.  The test may be used to diagnose or screen you for a condition that causes weak or thin bones  (osteoporosis), predict your risk for a broken bone (fracture), or determine how well your osteoporosis treatment is working. The bone density test is recommended for females 65 and older, or females or males <65 if certain risk factors such as thyroid disease, long term use of steroids such as for asthma or rheumatological issues, vitamin D deficiency, estrogen deficiency, family history of osteoporosis, self or family history of fragility fracture in first degree relative.    Heart health: Get at least 150 minutes of aerobic exercise weekly Limit alcohol It is important to maintain a healthy blood pressure and healthy cholesterol numbers  Heart disease screening: Screening for heart disease includes screening for blood pressure, fasting lipids, glucose/diabetes screening, BMI height to weight ratio, reviewed of smoking status, physical activity, and diet.    Goals include blood pressure 120/80 or less, maintaining a  healthy lipid/cholesterol profile, preventing diabetes or keeping diabetes numbers under good control, not smoking or using tobacco products, exercising most days per week or at least 150 minutes per week of exercise, and eating healthy variety of fruits and vegetables, healthy oils, and avoiding unhealthy food choices like fried food, fast food, high sugar and high cholesterol foods.    Other tests may possibly include EKG test, CT coronary calcium score, echocardiogram, exercise treadmill stress test.    Medical care options: I recommend you continue to seek care here first for routine care.  We try really hard to have available appointments Monday through Friday daytime hours for sick visits, acute visits, and physicals.  Urgent care should be used for after hours and weekends for significant issues that cannot wait till the next day.  The emergency department should be used for significant potentially life-threatening emergencies.  The emergency department is expensive, can often  have long wait times for less significant concerns, so try to utilize primary care, urgent care, or telemedicine when possible to avoid unnecessary trips to the emergency department.  Virtual visits and telemedicine have been introduced since the pandemic started in 2020, and can be convenient ways to receive medical care.  We offer virtual appointments as well to assist you in a variety of options to seek medical care.    Separate significant issues discussed: History of alkaline phosphatase elevation, currently on vitamin D-repeat labs today  Vitamin D deficiency-continue supplement.  Eat fish regularly, get some sun exposure at least 15 minutes a day  Hypertension-continue current medication  Prediabetes, impaired glucose- updated lab screen today.  Avoid high carbohydrate diet, junk food, exercise regularly  Ongoing fatigue, mood changes, sleep disturbance-clinically several related factors causing this.  I recommend you consider counseling  I recommend a sleep study to evaluate sleep disturbance and to screen for sleep apnea  Exercise most days per week  Make sure you take time for yourself, but also spend time with people for social contact  Work on sleep hygiene  Consider Qsymia for appetite suppression, weight loss, and energy boost.  She inquired about medications to help.    Jazmyne was seen today for annual exam.  Diagnoses and all orders for this visit:  Routine general medical examination at a health care facility -     EKG 12-Lead -     TSH -     CBC with Differential/Platelet -     Comprehensive metabolic panel -     VITAMIN D 25 Hydroxy (Vit-D Deficiency, Fractures) -     Lipid panel -     Hemoglobin A1c -     POCT Urinalysis DIP (Proadvantage Device)  Prediabetes -     Hemoglobin A1c  Impaired fasting blood sugar -     Hemoglobin A1c  Vitamin D deficiency -     VITAMIN D 25 Hydroxy (Vit-D Deficiency, Fractures)  High risk medication use  Essential  hypertension, benign  Fatigue, unspecified type -     TSH -     CBC with Differential/Platelet -     Comprehensive metabolic panel  Alkaline phosphatase elevation -     Comprehensive metabolic panel  Sleep disturbance  Vaccine counseling  Snoring  Mood change    Follow-up pending labs, yearly for physical

## 2021-05-07 LAB — CBC WITH DIFFERENTIAL/PLATELET
Basophils Absolute: 0 10*3/uL (ref 0.0–0.2)
Basos: 0 %
EOS (ABSOLUTE): 0.1 10*3/uL (ref 0.0–0.4)
Eos: 1 %
Hematocrit: 37.5 % (ref 34.0–46.6)
Hemoglobin: 12.1 g/dL (ref 11.1–15.9)
Immature Grans (Abs): 0 10*3/uL (ref 0.0–0.1)
Immature Granulocytes: 1 %
Lymphocytes Absolute: 2.3 10*3/uL (ref 0.7–3.1)
Lymphs: 36 %
MCH: 29.4 pg (ref 26.6–33.0)
MCHC: 32.3 g/dL (ref 31.5–35.7)
MCV: 91 fL (ref 79–97)
Monocytes Absolute: 0.4 10*3/uL (ref 0.1–0.9)
Monocytes: 7 %
Neutrophils Absolute: 3.5 10*3/uL (ref 1.4–7.0)
Neutrophils: 55 %
Platelets: 324 10*3/uL (ref 150–450)
RBC: 4.12 x10E6/uL (ref 3.77–5.28)
RDW: 13.5 % (ref 11.7–15.4)
WBC: 6.3 10*3/uL (ref 3.4–10.8)

## 2021-05-07 LAB — LIPID PANEL
Chol/HDL Ratio: 4.6 ratio — ABNORMAL HIGH (ref 0.0–4.4)
Cholesterol, Total: 151 mg/dL (ref 100–199)
HDL: 33 mg/dL — ABNORMAL LOW (ref >39)
LDL Chol Calc (NIH): 106 mg/dL — ABNORMAL HIGH (ref 0–99)
Triglycerides: 61 mg/dL (ref 0–149)
VLDL Cholesterol Cal: 12 mg/dL (ref 5–40)

## 2021-05-07 LAB — COMPREHENSIVE METABOLIC PANEL
ALT: 17 IU/L (ref 0–32)
AST: 20 IU/L (ref 0–40)
Albumin/Globulin Ratio: 1.4 (ref 1.2–2.2)
Albumin: 4 g/dL (ref 3.8–4.8)
Alkaline Phosphatase: 122 IU/L — ABNORMAL HIGH (ref 44–121)
BUN/Creatinine Ratio: 13 (ref 9–23)
BUN: 10 mg/dL (ref 6–24)
Bilirubin Total: 0.3 mg/dL (ref 0.0–1.2)
CO2: 23 mmol/L (ref 20–29)
Calcium: 8.6 mg/dL — ABNORMAL LOW (ref 8.7–10.2)
Chloride: 103 mmol/L (ref 96–106)
Creatinine, Ser: 0.8 mg/dL (ref 0.57–1.00)
Globulin, Total: 2.8 g/dL (ref 1.5–4.5)
Glucose: 105 mg/dL — ABNORMAL HIGH (ref 65–99)
Potassium: 3.8 mmol/L (ref 3.5–5.2)
Sodium: 140 mmol/L (ref 134–144)
Total Protein: 6.8 g/dL (ref 6.0–8.5)
eGFR: 90 mL/min/{1.73_m2} (ref >59)

## 2021-05-07 LAB — TSH: TSH: 1.35 u[IU]/mL (ref 0.450–4.500)

## 2021-05-07 LAB — HEMOGLOBIN A1C
Est. average glucose Bld gHb Est-mCnc: 140 mg/dL
Hgb A1c MFr Bld: 6.5 % — ABNORMAL HIGH (ref 4.8–5.6)

## 2021-05-07 LAB — VITAMIN D 25 HYDROXY (VIT D DEFICIENCY, FRACTURES): Vit D, 25-Hydroxy: 32.8 ng/mL (ref 30.0–100.0)

## 2021-05-08 ENCOUNTER — Encounter: Payer: Self-pay | Admitting: Medical

## 2021-05-11 ENCOUNTER — Other Ambulatory Visit: Payer: Self-pay | Admitting: Medical

## 2021-05-11 DIAGNOSIS — R0683 Snoring: Secondary | ICD-10-CM

## 2021-05-11 DIAGNOSIS — R5383 Other fatigue: Secondary | ICD-10-CM

## 2021-05-12 ENCOUNTER — Other Ambulatory Visit: Payer: Self-pay | Admitting: Medical

## 2021-05-15 ENCOUNTER — Encounter: Payer: Self-pay | Admitting: Medical

## 2021-05-19 ENCOUNTER — Other Ambulatory Visit: Payer: Self-pay | Admitting: Medical

## 2021-05-19 MED ORDER — QSYMIA 7.5-46 MG PO CP24: 1.0000 | ORAL_CAPSULE | ORAL | 1 refills | Status: DC

## 2021-06-01 ENCOUNTER — Other Ambulatory Visit: Payer: Self-pay | Admitting: Medical

## 2021-06-01 MED ORDER — TOPIRAMATE ER 25 MG PO CAP24: 1.0000 | ORAL_CAPSULE | Freq: Every day | ORAL | 0 refills | Status: DC

## 2021-06-01 MED ORDER — PHENTERMINE HCL 37.5 MG PO TABS: 37.5000 mg | ORAL_TABLET | Freq: Every day | ORAL | 0 refills | Status: DC

## 2021-06-03 ENCOUNTER — Other Ambulatory Visit: Payer: Self-pay | Admitting: Medical

## 2021-06-24 ENCOUNTER — Telehealth: Payer: Self-pay

## 2021-06-24 IMAGING — CR DG CHEST 2V
2 series · 2 of 2 positions shown · non-contrast
Comparison: None

CLINICAL DATA: Dry cough for 1 month, history of hypertension

EXAM:
CHEST - 2 VIEW

[w chest pa]
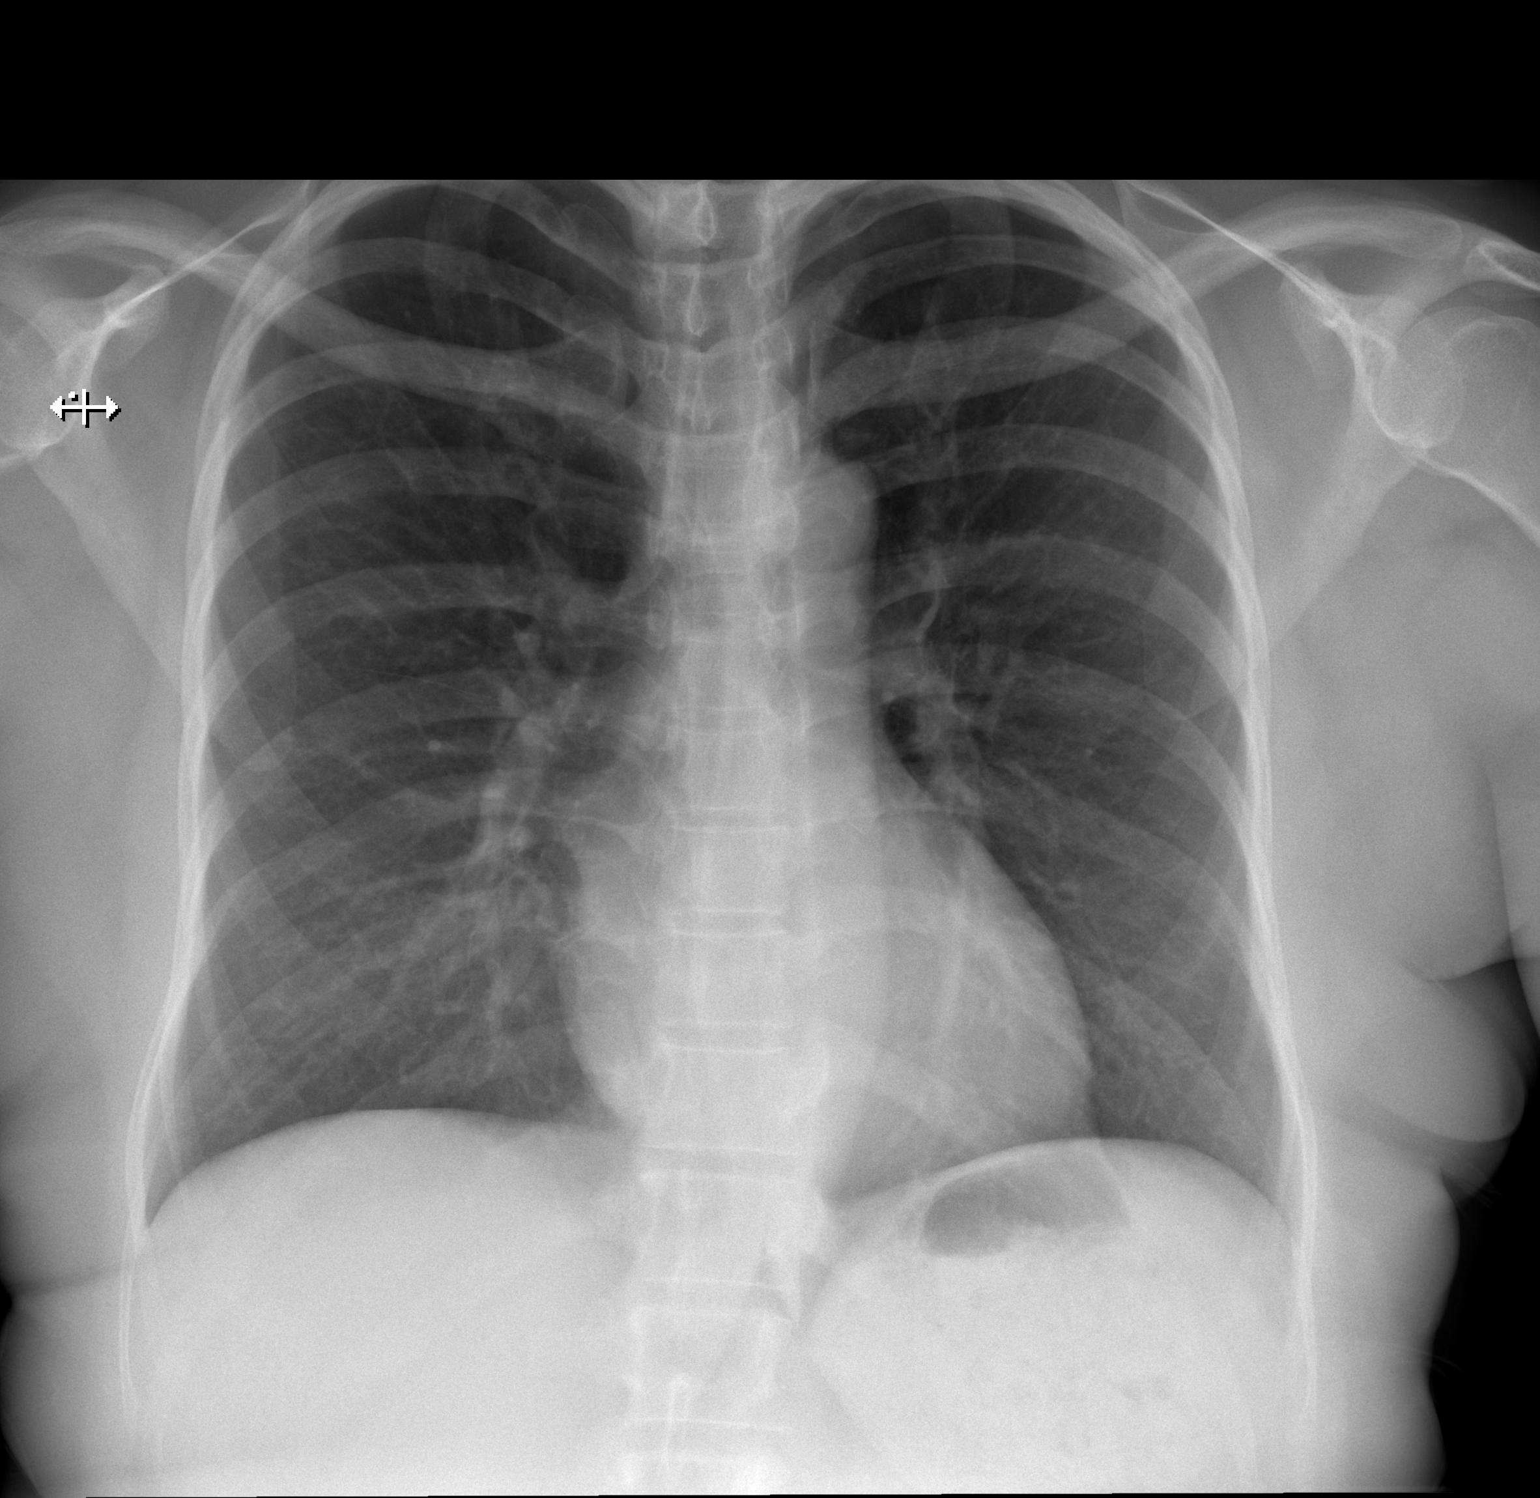

[w chest lat]
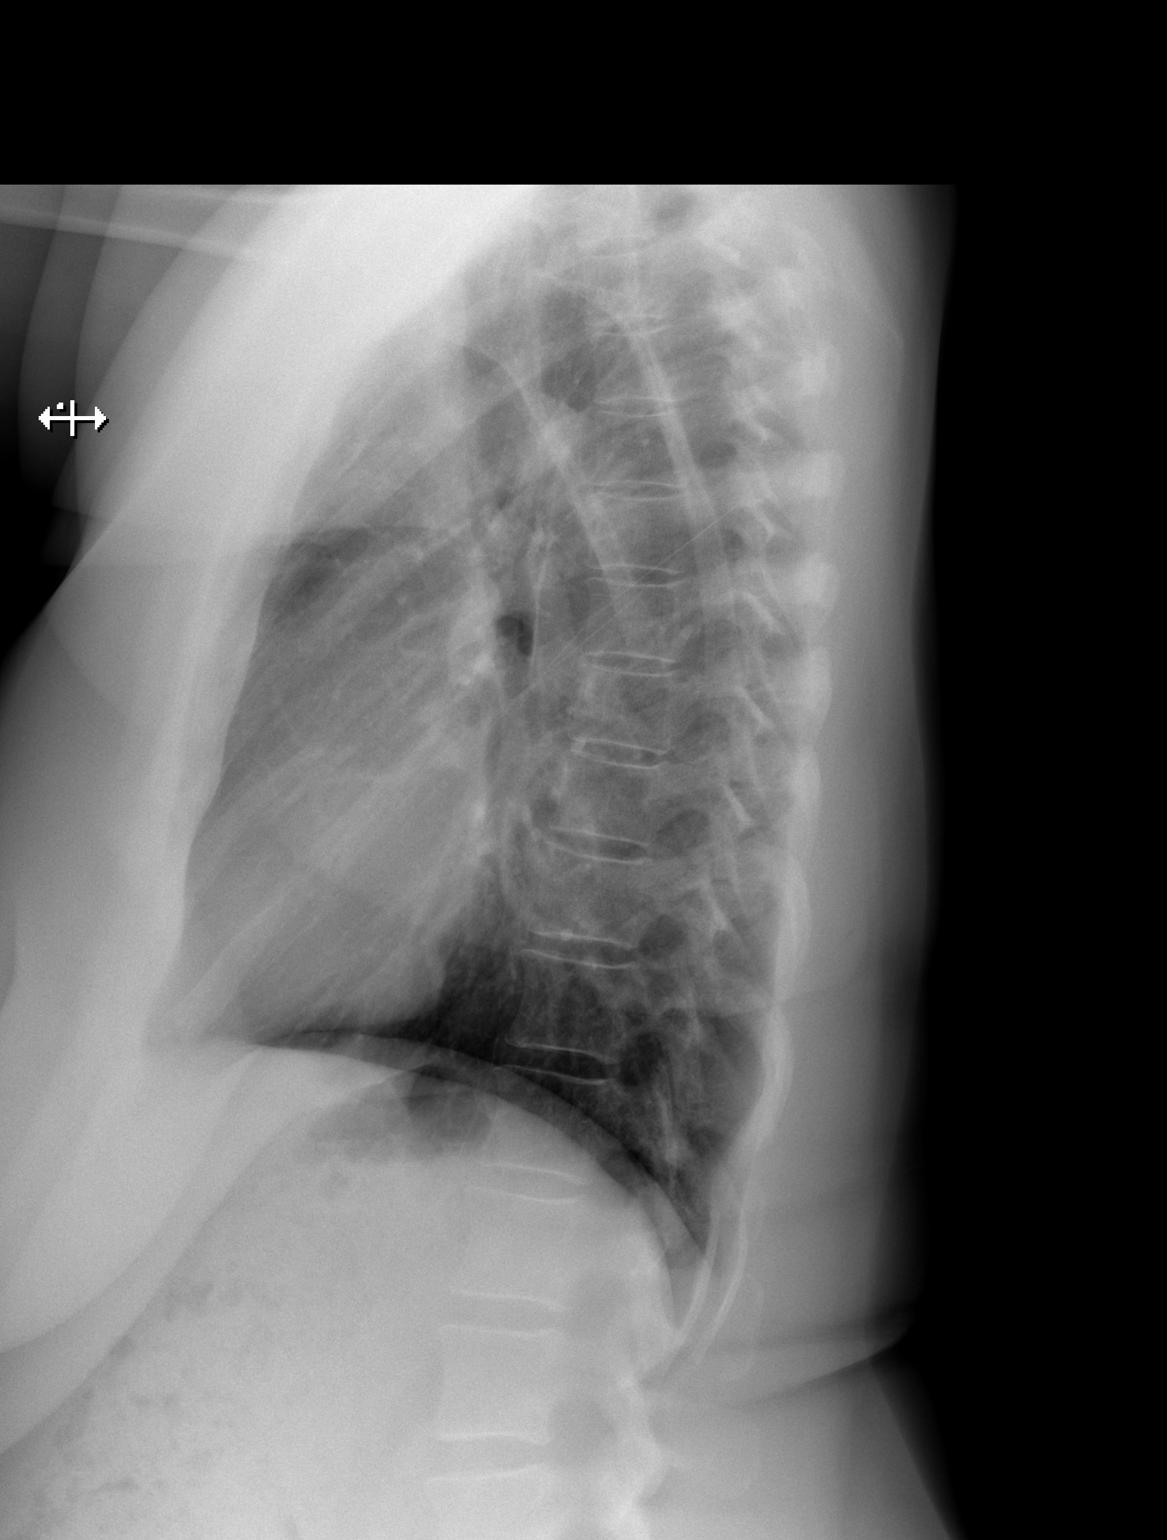

[2 of 2 positions shown; findings below may reference images not displayed]

FINDINGS: No consolidation, features of edema, pneumothorax, or effusion.
Pulmonary vascularity is normally distributed. The cardiomediastinal
contours are unremarkable. No acute osseous or soft tissue
abnormality.
IMPRESSION: No acute cardiopulmonary abnormality.

## 2021-06-24 NOTE — Telephone Encounter (Signed)
Pt called to advise since she started taking the trokendi and phentermine  she has not been able to sleep. Pt has stopped both med as of Saturday . Does she still need the follow appointment?

## 2021-06-25 NOTE — Telephone Encounter (Signed)
Lvm advising pt to call back and schedule a follow on concerns [facility]

## 2021-07-11 ENCOUNTER — Other Ambulatory Visit: Payer: Self-pay | Admitting: Medical

## 2021-07-29 ENCOUNTER — Encounter: Payer: Self-pay | Admitting: Neurology

## 2021-07-29 ENCOUNTER — Ambulatory Visit: Payer: 59 | Admitting: Neurology

## 2021-07-29 VITALS — BP 125/87 | HR 76 | Ht 62.0 in | Wt 147.2 lb

## 2021-07-29 DIAGNOSIS — G4719 Other hypersomnia: Secondary | ICD-10-CM | POA: Diagnosis not present

## 2021-07-29 DIAGNOSIS — R0683 Snoring: Secondary | ICD-10-CM

## 2021-07-29 DIAGNOSIS — R351 Nocturia: Secondary | ICD-10-CM

## 2021-07-29 DIAGNOSIS — E663 Overweight: Secondary | ICD-10-CM | POA: Diagnosis not present

## 2021-07-29 NOTE — Patient Instructions (Signed)

## 2021-07-29 NOTE — Progress Notes (Signed)
Subjective:    Anne Bender [name] Anne Bender Bender is a 50 y.o. female.  HPI    Anne Bender Foley, MD, PhD [facility] [address] [address] [place], [place] [address]   Dear Anne Bender Anne Bender Bender,   I saw your Anne Bender Bender, Anne Bender Anne Bender Bender, upon your kind request in my sleep clinic today for initial consultation of Anne Bender Anne Bender Bender sleep disorder, in particular, concern for underlying obstructive sleep apnea.  Anne Bender Anne Bender Bender is unaccompanied today.  As you know, Anne Bender Anne Bender Bender is a 50 year old right-handed woman with an underlying medical history of hypertension, hyperlipidemia, impaired fasting glucose, and overweight state, who reports snoring and excessive daytime somnolence.  I reviewed your office note from 05/06/2021.  Anne Bender Anne Bender Bender Epworth sleepiness score is 7 out of 24, fatigue severity score is 27 out of 63. Anne Bender Anne Bender Bender sleepiness is mostly noticeable in Anne Bender evening hours when she is trying to stay awake to watch something on TV and she routinely falls asleep on Anne Bender couch only to wake up around 1 AM or later.  She tries to be in bed around 11 PM and rise time is around 6:30 AM.  She denies recurrent morning headaches but does have nocturia generally in Anne Bender early morning hours between 2 and 4 AM once per average night.  She suspects that Anne Bender Anne Bender Bender father has sleep apnea, she is familiar with Anne Bender diagnosis of OSA as Anne Bender Anne Bender Bender husband has a CPAP machine.  She does not drink caffeine on a day-to-day basis and drinks alcohol socially/occasionally, she is a non-smoker.  She lives with Anne Bender Anne Bender Bender husband and Anne Bender Anne Bender Bender 2 sons, ages 63 and 79.  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  She also has an older daughter age 27 who is on Anne Bender Anne Bender Bender own.  She does have a TV in Anne Bender bedroom and often watches it at night in Anne Bender bedroom, puts it on a sleep timer.  They do not currently have a pet in Anne Bender house but they are considering getting a puppy.  She has been working on weight loss.  She does not currently take any Trokendi or phentermine as she had insomnia as a side  effect.  Anne Bender Anne Bender Bender Past Medical History Is Significant For: Past Medical History:  Diagnosis Date   Alkaline phosphatase elevation 2019   isoenzymes normal, presumed related to vit D deficiency   Blood transfusion without reported diagnosis    with prior gynecology surgery   Hyperlipidemia    Hypertension 2010   Impaired fasting blood sugar 2019   Wears glasses     Anne Bender Anne Bender Bender Past Surgical History Is Significant For: Past Surgical History:  Procedure Laterality Date   CESAREAN SECTION  2010   MYOMECTOMY  2010   OVARIAN CYST REMOVAL  2013    Anne Bender Anne Bender Bender Family History Is Significant For: Family History  Problem Relation Age of Onset   Stroke Mother 44   Hypertension Mother    Heart disease Mother 37       pacemaker, CHF   Pulmonary embolism Father    Hypertension Father    Peripheral vascular disease Father    Hypertension Sister    Hypertension Brother    Diabetes Paternal Grandmother    Hypertension Paternal Grandmother    Cancer Paternal Grandmother        breast   Cancer Maternal Aunt        breast   Hypertension Brother     Anne Bender Anne Bender Bender Social History Is Significant For: Social History   Socioeconomic History   Marital status: Married  Spouse name: Not on file   Number of children: Not on file   Years of education: Not on file   Highest education level: Not on file  Occupational History   Not on file  Tobacco Use   Smoking status: Never   Smokeless tobacco: Never  Vaping Use   Vaping Use: Never used  Substance and Sexual Activity   Alcohol use: Yes    Comment: rarely   Drug use: No   Sexual activity: Not on file  Other Topics Concern   Not on file  Social History Narrative   Married, has 3 children.  [employer].  Working on project for [employer] with [employer], school related project.  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  Exercises with Anne Bender Anne Bender Bender kids, PE with home school/virtual school.    04/2021   Social Determinants of Health   Financial Resource Strain: Not on file  Food Insecurity: Not on file  Transportation Needs: Not on file  Physical Activity: Not on file  Stress: Not on file  Social Connections: Not on file    Anne Bender Anne Bender Bender Allergies Are:  No Known Allergies:   Anne Bender Anne Bender Bender Current Medications Are:  Outpatient Encounter Medications as of 07/29/2021  Medication Sig   [facility] D3 25 MCG (1000 UT) capsule TAKE 1 CAPSULE BY MOUTH EVERY DAY   metoprolol succinate (TOPROL-XL) 25 MG 24 hr tablet TAKE 1 TABLET BY MOUTH EVERY DAY   telmisartan-hydrochlorothiazide (MICARDIS HCT) 40-12.5 MG tablet TAKE 1 TABLET BY MOUTH EVERY DAY   [DISCONTINUED] phentermine (ADIPEX-P) 37.5 MG tablet Take 1 tablet (37.5 mg total) by mouth daily before breakfast.   [DISCONTINUED] Topiramate ER (TROKENDI XR) 25 MG CP24 Take 1 capsule (25 mg total) by mouth daily.   No facility-administered encounter medications on file as of 07/29/2021.  :   Review of Systems:  Out of a complete 14 point review of systems, all are reviewed and negative with Anne Bender exception of these symptoms as listed below:  Review of Systems  Neurological:        Internal referral, Fatigue and snoring. ESS:  7  FSS:27   Objective:  Neurological Exam  Physical Exam Physical Examination:   Vitals:   07/29/21 1457  BP: 125/87  Pulse: 76    General Examination: Anne Bender Anne Bender Bender is a very pleasant 50 y.o. female in no acute distress. She appears well-developed and well-nourished and well groomed.   HEENT: Normocephalic, atraumatic, pupils are equal, round and reactive to light, extraocular tracking is good without limitation to gaze excursion or nystagmus noted. Hearing is grossly intact. Face is symmetric with normal facial animation. Speech is clear with no dysarthria noted. There is no hypophonia. There is no lip, neck/head, jaw or voice tremor. Neck is supple with full range of passive and active motion. There are no carotid bruits on  auscultation. Oropharynx exam reveals: mild mouth dryness, good dental hygiene and moderate airway crowding, due to small airway entry and tonsillar size of about 2+ bilaterally.  Mallampati class III.  Neck circumference of 13-1/8 inches.  She has a minimal overbite.  Tongue protrudes centrally and palate elevates symmetrically.  Nasal inspection reveals no significant mucosal swelling or septal deviation or inferior turbinate hypertrophy.  Chest: Clear to auscultation without wheezing, rhonchi or crackles noted.  Heart: S1+S2+0, regular and normal without murmurs, rubs or gallops noted.   Abdomen: Soft, non-tender and non-distended.  Extremities: There is no  pitting edema in Anne Bender distal lower extremities bilaterally.   Skin: Warm and dry without trophic changes noted.   Musculoskeletal: exam reveals no obvious joint deformities, tenderness or joint swelling or erythema.   Neurologically:  Mental status: Anne Bender Anne Bender Bender is awake, alert and oriented in all 4 spheres. Anne Bender Anne Bender Bender immediate and remote memory, attention, language skills and fund of knowledge are appropriate. There is no evidence of aphasia, agnosia, apraxia or anomia. Speech is clear with normal prosody and enunciation. Thought process is linear. Mood is normal and affect is normal.  Cranial nerves II - XII are as described above under HEENT exam.  Motor exam: Normal bulk, strength and tone is noted. There is no tremor, Romberg is negative. Fine motor skills and coordination: grossly intact.  Cerebellar testing: No dysmetria or intention tremor. There is no truncal or gait ataxia.  Sensory exam: intact to light touch in Anne Bender upper and lower extremities.  Gait, station and balance: She stands easily. No veering to one side is noted. No leaning to one side is noted. Posture is age-appropriate and stance is narrow based. Gait shows normal stride length and normal pace. No problems turning are noted. Tandem walk is unremarkable.               ]  Assessment and Plan:  In summary, Anne Bender Anne Bender Bender is a very pleasant 50 y.o.-year old female with an underlying medical history of hypertension, hyperlipidemia, impaired fasting glucose, and overweight state, whose history and physical exam are concerning for obstructive sleep apnea (OSA). I had a long chat with Anne Bender Anne Bender Bender about my findings and Anne Bender diagnosis of OSA, its prognosis and treatment options. We talked about medical treatments, surgical interventions and non-pharmacological approaches. I explained in particular Anne Bender risks and ramifications of untreated moderate to severe OSA, especially with respect to developing cardiovascular disease down Anne Bender Road, including congestive heart failure, difficult to treat hypertension, cardiac arrhythmias, or stroke. Even type 2 diabetes has, in part, been linked to untreated OSA. Symptoms of untreated OSA include daytime sleepiness, memory problems, mood irritability and mood disorder such as depression and anxiety, lack of energy, as well as recurrent headaches, especially morning headaches. We talked about trying to maintain a healthy lifestyle in general, as well as Anne Bender importance of weight control. We also talked about Anne Bender importance of good sleep hygiene. I recommended Anne Bender following at this time: sleep study.  I outlined Anne Bender difference between a laboratory attended sleep study versus home sleep test and we also talked about possible surgical and non-surgical treatment options of OSA, including Anne Bender use of a custom-made dental device (which would require a referral to a specialist dentist or oral surgeon), upper airway surgical options, such as traditional UPPP or a novel less invasive surgical option in Anne Bender form of Inspire hypoglossal nerve stimulation (which would involve a referral to an ENT surgeon). I also explained Anne Bender CPAP treatment option to Anne Bender Anne Bender Bender, who indicated that she would be willing to try CPAP if Anne Bender need arises.  We will pick up our discussion  after testing and we will keep Anne Bender Anne Bender Bender posted as to Anne Bender results by phone call or email through MyChart as well.  I answered all Anne Bender Anne Bender Bender questions today and she was in agreement with Anne Bender plan.   Thank you very much for allowing me to participate in Anne Bender care of this nice Anne Bender Bender. If I can be of any further assistance to you please do not hesitate to call me at [phone].  Sincerely,   Beronica Lansdale  Rexene Alberts, MD, PhD

## 2021-07-30 ENCOUNTER — Ambulatory Visit: Payer: 59 | Admitting: Medical

## 2021-08-05 ENCOUNTER — Ambulatory Visit: Payer: 59 | Admitting: Medical

## 2021-08-12 ENCOUNTER — Ambulatory Visit: Payer: 59 | Admitting: Medical

## 2021-08-12 ENCOUNTER — Other Ambulatory Visit: Payer: Self-pay

## 2021-08-12 VITALS — BP 120/70 | HR 74 | Wt 148.0 lb

## 2021-08-12 DIAGNOSIS — E282 Polycystic ovarian syndrome: Secondary | ICD-10-CM | POA: Diagnosis not present

## 2021-08-12 DIAGNOSIS — R7301 Impaired fasting glucose: Secondary | ICD-10-CM | POA: Diagnosis not present

## 2021-08-12 DIAGNOSIS — R7303 Prediabetes: Secondary | ICD-10-CM

## 2021-08-12 DIAGNOSIS — T50905A Adverse effect of unspecified drugs, medicaments and biological substances, initial encounter: Secondary | ICD-10-CM | POA: Insufficient documentation

## 2021-08-12 DIAGNOSIS — Z23 Encounter for immunization: Secondary | ICD-10-CM | POA: Insufficient documentation

## 2021-08-12 LAB — POCT GLYCOSYLATED HEMOGLOBIN (HGB A1C): Hemoglobin A1C: 6.2 % — AB (ref 4.0–5.6)

## 2021-08-12 MED ORDER — METFORMIN HCL ER 500 MG PO TB24: 500.0000 mg | ORAL_TABLET | Freq: Every day | ORAL | 1 refills | Status: DC

## 2021-08-12 NOTE — Progress Notes (Addendum)
Subjective:  Anne Bender is a 50 y.o. female who presents for Chief Complaint  Patient presents with   Follow-up    Follow-up on medication      Here for follow-up on BMI and impaired glucose.  Last visit her physical we discussed medications that can help with prediabetes and weight loss.  She did not want to use any type of injectable medicine.  She did do a trial of phentermine and Toekndi XR but did not tolerate that medicine.  It kept her seemingly awake for a week, did not sleep well.  She stopped the medications.  Since last visit she has really made great attempts and limiting sugars cutting out sweets and candy and exercising.  She could exercise a little more but is exercising some  She saw neurology recently and has a sleep study pending.  She has seen nutritionist in the past.  She notes in the past she was on metformin because of PCOS.  She sees gynecology.  She was thought to have insulin resistance along with a PCOS  No other aggravating or relieving factors.    No other c/o.  The following portions of the patient's history were reviewed and updated as appropriate: allergies, current medications, past family history, past medical history, past social history, past surgical history and problem list.  ROS Otherwise as in subjective above  Objective: BP 120/70   Pulse 74   Wt 148 lb (67.1 kg)   BMI 27.07 kg/m   General appearance: alert, no distress, well developed, well nourished Otherwise not examined    Assessment: Encounter Diagnoses  Name Primary?   Impaired fasting blood sugar Yes   Needs flu shot    Prediabetes    PCOS (polycystic ovarian syndrome)    Adverse effect of drug/medicinal, initial encounter      Plan: We discussed her labs from her physical a few months ago.  We discussed that she did not tolerate the phentermine and Tokendi.  She will begin back on metformin for insulin resistance PCOS and impaired glucose.  Hemoglobin A1c test today  6.2%  Continue to work on healthy lifestyle and exercise.  Increase exercise including weightbearing exercise.  Counseled on the influenza virus vaccine.  Vaccine information sheet given.  Influenza vaccine given after consent obtained.  Anne Bender was seen today for follow-up.  Diagnoses and all orders for this visit:  Impaired fasting blood sugar -     HgB A1c  Needs flu shot -     Flu Vaccine QUAD 69mo+IM (Fluarix, Fluzone & Alfiuria Quad PF)  Prediabetes -     HgB A1c  PCOS (polycystic ovarian syndrome)  Adverse effect of drug/medicinal, initial encounter  Other orders -     metFORMIN (GLUCOPHAGE XR) 500 MG 24 hr tablet; Take 1 tablet (500 mg total) by mouth daily with breakfast.   Follow up: 74mo

## 2021-08-17 ENCOUNTER — Other Ambulatory Visit: Payer: Self-pay | Admitting: Medical

## 2021-08-21 ENCOUNTER — Telehealth: Payer: Self-pay

## 2021-08-21 NOTE — Telephone Encounter (Signed)
LVM for pt to call me back to schedule sleep study  

## 2021-09-01 ENCOUNTER — Telehealth: Payer: Self-pay

## 2021-09-01 NOTE — Telephone Encounter (Signed)
LVM for pt to call me back to schedule sleep study  

## 2021-09-02 ENCOUNTER — Other Ambulatory Visit: Payer: Self-pay | Admitting: Medical

## 2021-09-07 ENCOUNTER — Telehealth: Payer: Self-pay

## 2021-09-07 NOTE — Telephone Encounter (Signed)
We have attempted to call the patient 2 times to schedule sleep study. Patient has been unavailable at the phone numbers we have on file and has not returned our calls. If patient calls back we will schedule them for their sleep study. ° °

## 2021-10-20 ENCOUNTER — Other Ambulatory Visit: Payer: Self-pay | Admitting: Medical

## 2021-11-13 ENCOUNTER — Other Ambulatory Visit: Payer: Self-pay | Admitting: Medical

## 2022-01-14 ENCOUNTER — Encounter: Payer: Self-pay | Admitting: Internal Medicine

## 2022-02-15 ENCOUNTER — Encounter: Payer: 59 | Admitting: Medical

## 2022-02-24 ENCOUNTER — Encounter: Payer: Self-pay | Admitting: Medical

## 2022-03-09 ENCOUNTER — Other Ambulatory Visit: Payer: Self-pay | Admitting: Medical

## 2022-03-09 LAB — HM MAMMOGRAPHY

## 2022-03-19 ENCOUNTER — Other Ambulatory Visit: Payer: Self-pay | Admitting: Medical

## 2022-05-10 ENCOUNTER — Encounter: Payer: 59 | Admitting: Medical

## 2022-05-14 ENCOUNTER — Other Ambulatory Visit: Payer: Self-pay | Admitting: Medical

## 2022-06-06 ENCOUNTER — Other Ambulatory Visit: Payer: Self-pay | Admitting: Medical

## 2022-06-24 ENCOUNTER — Encounter: Payer: Self-pay | Admitting: Medical

## 2022-06-24 ENCOUNTER — Ambulatory Visit (INDEPENDENT_AMBULATORY_CARE_PROVIDER_SITE_OTHER): Payer: Commercial Managed Care - PPO | Admitting: Medical

## 2022-06-24 ENCOUNTER — Other Ambulatory Visit: Payer: Self-pay

## 2022-06-24 VITALS — BP 120/80 | HR 60 | Wt 144.2 lb

## 2022-06-24 DIAGNOSIS — Z0000 Encounter for general adult medical examination without abnormal findings: Secondary | ICD-10-CM | POA: Diagnosis not present

## 2022-06-24 DIAGNOSIS — Z13220 Encounter for screening for lipoid disorders: Secondary | ICD-10-CM

## 2022-06-24 DIAGNOSIS — R7303 Prediabetes: Secondary | ICD-10-CM

## 2022-06-24 DIAGNOSIS — E559 Vitamin D deficiency, unspecified: Secondary | ICD-10-CM | POA: Diagnosis not present

## 2022-06-24 DIAGNOSIS — Z23 Encounter for immunization: Secondary | ICD-10-CM

## 2022-06-24 DIAGNOSIS — Z7185 Encounter for immunization safety counseling: Secondary | ICD-10-CM | POA: Diagnosis not present

## 2022-06-24 DIAGNOSIS — R748 Abnormal levels of other serum enzymes: Secondary | ICD-10-CM

## 2022-06-24 DIAGNOSIS — E282 Polycystic ovarian syndrome: Secondary | ICD-10-CM

## 2022-06-24 DIAGNOSIS — R7301 Impaired fasting glucose: Secondary | ICD-10-CM

## 2022-06-24 DIAGNOSIS — I10 Essential (primary) hypertension: Secondary | ICD-10-CM

## 2022-06-24 NOTE — Progress Notes (Signed)
Subjective:   HPI  Anne Bender is a 51 y.o. female who presents for Chief Complaint  Patient presents with   Annual Exam    Fasting CPE- No other concerns. Paps done a [address] Mammo completed in April. Colonoscopy scheduled for August 11th.    Patient Care Team: Dorcus Riga, Cleda Mccreedy as PCP - General (Family Medicine) Sees dentist Sees eye doctor Dr. Charna Elizabeth, GI Gynecology, Dr. Debby Bud  Concerns: Here for physical.  Doing well, no particular issues.  Has colonoscopy scheduled in 2 weeks.   Reviewed their medical, surgical, family, social, medication, and allergy history and updated chart as appropriate.  Past Medical History:  Diagnosis Date   Alkaline phosphatase elevation 2019   isoenzymes normal, presumed related to vit D deficiency   Blood transfusion without reported diagnosis    with prior gynecology surgery   Hyperlipidemia    Hypertension 2010   Impaired fasting blood sugar 2019   Wears glasses     Family History  Problem Relation Age of Onset   Stroke Mother 75   Hypertension Mother    Heart disease Mother 63       pacemaker, CHF   Pulmonary embolism Father    Hypertension Father    Peripheral vascular disease Father    Hypertension Sister    Hypertension Brother    Diabetes Paternal Grandmother    Hypertension Paternal Grandmother    Cancer Paternal Grandmother        breast   Cancer Maternal Aunt        breast   Hypertension Brother      Current Outpatient Medications:    metFORMIN (GLUCOPHAGE-XR) 500 MG 24 hr tablet, TAKE 1 TABLET BY MOUTH EVERY DAY WITH BREAKFAST, Disp: 90 tablet, Rfl: 0   metoprolol succinate (TOPROL-XL) 25 MG 24 hr tablet, TAKE 1 TABLET BY MOUTH EVERY DAY, Disp: 90 tablet, Rfl: 0   telmisartan-hydrochlorothiazide (MICARDIS HCT) 40-12.5 MG tablet, TAKE 1 TABLET BY MOUTH EVERY DAY, Disp: 90 tablet, Rfl: 0   Cholecalciferol (VITAMIN D3) 25 MCG (1000 UT) CAPS, TAKE 1 CAPSULE BY MOUTH EVERY DAY  (Patient not taking: Reported on 06/24/2022), Disp: 90 capsule, Rfl: 0  Allergies  Allergen Reactions   Phentermine     Insomnia       Review of Systems Constitutional: -fever, -chills, -sweats, -unexpected weight change, -decreased appetite, -fatigue Allergy: -sneezing, -itching, -congestion Dermatology: -changing moles, --rash, -lumps ENT: -runny nose, -ear pain, -sore throat, -hoarseness, -sinus pain, -teeth pain, - ringing in ears, -hearing loss, -nosebleeds Cardiology: -chest pain, -palpitations, -swelling, -difficulty breathing when lying flat, -waking up short of breath Respiratory: -cough, -shortness of breath, -difficulty breathing with exercise or exertion, -wheezing, -coughing up blood Gastroenterology: -abdominal pain, -nausea, -vomiting, -diarrhea, -constipation, -blood in stool, -changes in bowel movement, -difficulty swallowing or eating Hematology: -bleeding, -bruising  Musculoskeletal: -joint aches, -muscle aches, -joint swelling, -back pain, -neck pain, -cramping, -changes in gait Ophthalmology: denies vision changes, eye redness, itching, discharge Urology: -burning with urination, -difficulty urinating, -blood in urine, -urinary frequency, -urgency, -incontinence Neurology: -headache, -weakness, -tingling, -numbness, -memory loss, -falls, -dizziness Psychology: -depressed mood, -agitation, -sleep problems Breast/gyn: -breast tendnerss, -discharge, -lumps, -vaginal discharge,- irregular periods, -heavy periods      06/24/2022   10:39 AM 08/12/2021    3:00 PM 05/06/2021    8:34 AM 01/09/2020    9:21 AM 09/26/2019    8:58 AM  Depression screen PHQ 2/9  Decreased Interest 0 0 1 3 1   Down,  Depressed, Hopeless 0 0 1 1 1   PHQ - 2 Score 0 0 2 4 2   Altered sleeping   1 1 0  Tired, decreased energy   1 1 1   Change in appetite   1 3 0  Feeling bad or failure about yourself    0 3 0  Trouble concentrating   0 2 0  Moving slowly or fidgety/restless   0 0 0  Suicidal  thoughts   0 0 0  PHQ-9 Score   5 14 3   Difficult doing work/chores   Somewhat difficult Very difficult Not difficult at all       Objective:  BP 120/80   Pulse 60   Wt 144 lb 3.2 oz (65.4 kg)   SpO2 99%   BMI 26.37 kg/m   General appearance: alert, no distress, WD/WN, African American female Skin: unremarkable HEENT: normocephalic, conjunctiva/corneas normal, sclerae anicteric, PERRLA, EOMi, nares patent, no discharge or erythema, pharynx normal Neck: supple, no lymphadenopathy, no thyromegaly, no masses, normal ROM, no bruits Chest: non tender, normal shape and expansion Heart: RRR, normal S1, S2, no murmurs Lungs: CTA bilaterally, no wheezes, rhonchi, or rales Abdomen: +bs, soft, non tender, non distended, no masses, no hepatomegaly, no splenomegaly, no bruits Back: non tender, normal ROM, no scoliosis Musculoskeletal: upper extremities non tender, no obvious deformity, normal ROM throughout, lower extremities non tender, no obvious deformity, normal ROM throughout Extremities: no edema, no cyanosis, no clubbing Pulses: 2+ symmetric, upper and lower extremities, normal cap refill Neurological: alert, oriented x 3, CN2-12 intact, strength normal upper extremities and lower extremities, sensation normal throughout, DTRs 2+ throughout, no cerebellar signs, gait normal Psychiatric: normal affect, behavior normal, pleasant  Breast/gyn/rectal - deferred to gynecology   Assessment and Plan :   Encounter Diagnoses  Name Primary?   Need for Tdap vaccination Yes   Vaccine counseling    Encounter for health maintenance examination in adult    Vitamin D deficiency    Alkaline phosphatase elevation    Essential hypertension, benign    Impaired fasting blood sugar    PCOS (polycystic ovarian syndrome)    Prediabetes    Screening for lipid disorders     This visit was a preventative care visit, also known as wellness visit or routine physical.   Topics typically include healthy  lifestyle, diet, exercise, preventative care, vaccinations, sick and well care, proper use of emergency dept and after hours care, as well as other concerns.     Recommendations: Continue to return yearly for your annual wellness and preventative care visits.  This gives a chance to discuss healthy lifestyle, exercise, vaccinations, review your chart record, and perform screenings where appropriate.  I recommend you see your eye doctor yearly for routine vision care.  I recommend you see your dentist yearly for routine dental care including hygiene visits twice yearly.  See your gynecologist yearly for routine gynecological care.   Vaccination recommendations were reviewed Immunization History  Administered Date(s) Administered   Influenza,inj,Quad PF,6+ Mos 08/09/2017, 08/17/2018, 09/26/2019, 08/12/2021   PFIZER(Purple Top)SARS-COV-2 Vaccination 12/30/2019, 02/15/2020, 03/07/2020   Td 12/14/2011    Shingles vaccine:  I recommend you have a shingles vaccine to help prevent shingles or herpes zoster outbreak.   Please call your insurer to inquire about coverage for the Shingrix vaccine given in 2 doses.   Some insurers cover this vaccine after age 73, some cover this after age 67.  If your insurer covers this, then call to schedule appointment  to have this vaccine here.  Counseled on the Tdap (tetanus, diptheria, and acellular pertussis) vaccine.  Vaccine information sheet given. Tdap vaccine given after consent obtained.    Screening for cancer: Colon cancer screening: F/u as scheduled for colonoscopy in 2 weeks  Will request recent pap and mammogram from gynecology  Skin cancer screening: Check your skin regularly for new changes, growing lesions, or other lesions of concern Come in for evaluation if you have skin lesions of concern.  Lung cancer screening: If you have a greater than 20 pack year history of tobacco use, then you may qualify for lung cancer screening with a  chest CT scan.   Please call your insurance company to inquire about coverage for this test.  We currently don't have screenings for other cancers besides breast, cervical, colon, and lung cancers.  If you have a strong family history of cancer or have other cancer screening concerns, please let me know.    Bone health: Get at least 150 minutes of aerobic exercise weekly Get weight bearing exercise at least once weekly Bone density test:  A bone density test is an imaging test that uses a type of X-ray to measure the amount of calcium and other minerals in your bones. The test may be used to diagnose or screen you for a condition that causes weak or thin bones (osteoporosis), predict your risk for a broken bone (fracture), or determine how well your osteoporosis treatment is working. The bone density test is recommended for females 65 and older, or females or males <65 if certain risk factors such as thyroid disease, long term use of steroids such as for asthma or rheumatological issues, vitamin D deficiency, estrogen deficiency, family history of osteoporosis, self or family history of fragility fracture in first degree relative.    Heart health: Get at least 150 minutes of aerobic exercise weekly Limit alcohol It is important to maintain a healthy blood pressure and healthy cholesterol numbers  Heart disease screening: Screening for heart disease includes screening for blood pressure, fasting lipids, glucose/diabetes screening, BMI height to weight ratio, reviewed of smoking status, physical activity, and diet.    Goals include blood pressure 120/80 or less, maintaining a healthy lipid/cholesterol profile, preventing diabetes or keeping diabetes numbers under good control, not smoking or using tobacco products, exercising most days per week or at least 150 minutes per week of exercise, and eating healthy variety of fruits and vegetables, healthy oils, and avoiding unhealthy food choices like  fried food, fast food, high sugar and high cholesterol foods.      Medical care options: I recommend you continue to seek care here first for routine care.  We try really hard to have available appointments Monday through Friday daytime hours for sick visits, acute visits, and physicals.  Urgent care should be used for after hours and weekends for significant issues that cannot wait till the next day.  The emergency department should be used for significant potentially life-threatening emergencies.  The emergency department is expensive, can often have long wait times for less significant concerns, so try to utilize primary care, urgent care, or telemedicine when possible to avoid unnecessary trips to the emergency department.  Virtual visits and telemedicine have been introduced since the pandemic started in 2020, and can be convenient ways to receive medical care.  We offer virtual appointments as well to assist you in a variety of options to seek medical care.    Separate significant issues discussed: Hypertension-continue current medication  Prediabetes, impaired glucose- updated lab screen today.  Avoid high carbohydrate diet, junk food, exercise regularly  Decie was seen today for annual exam.  Diagnoses and all orders for this visit:  Need for Tdap vaccination  Vaccine counseling  Encounter for health maintenance examination in adult -     Comprehensive metabolic panel -     CBC -     Lipid panel -     Hemoglobin A1c -     VITAMIN D 25 Hydroxy (Vit-D Deficiency, Fractures)  Vitamin D deficiency -     VITAMIN D 25 Hydroxy (Vit-D Deficiency, Fractures)  Alkaline phosphatase elevation  Essential hypertension, benign  Impaired fasting blood sugar -     Hemoglobin A1c  PCOS (polycystic ovarian syndrome)  Prediabetes  Screening for lipid disorders -     Lipid panel     Follow-up pending labs, yearly for physical

## 2022-06-25 ENCOUNTER — Other Ambulatory Visit: Payer: Self-pay | Admitting: Medical

## 2022-06-25 LAB — CBC
Hematocrit: 41 % (ref 34.0–46.6)
Hemoglobin: 13.2 g/dL (ref 11.1–15.9)
MCH: 29.1 pg (ref 26.6–33.0)
MCHC: 32.2 g/dL (ref 31.5–35.7)
MCV: 90 fL (ref 79–97)
Platelets: 342 10*3/uL (ref 150–450)
RBC: 4.54 x10E6/uL (ref 3.77–5.28)
RDW: 13.6 % (ref 11.7–15.4)
WBC: 6 10*3/uL (ref 3.4–10.8)

## 2022-06-25 LAB — COMPREHENSIVE METABOLIC PANEL
ALT: 21 IU/L (ref 0–32)
AST: 21 IU/L (ref 0–40)
Albumin/Globulin Ratio: 1.4 (ref 1.2–2.2)
Albumin: 4.3 g/dL (ref 3.8–4.9)
Alkaline Phosphatase: 138 IU/L — ABNORMAL HIGH (ref 44–121)
BUN/Creatinine Ratio: 14 (ref 9–23)
BUN: 12 mg/dL (ref 6–24)
Bilirubin Total: 0.6 mg/dL (ref 0.0–1.2)
CO2: 25 mmol/L (ref 20–29)
Calcium: 9.7 mg/dL (ref 8.7–10.2)
Chloride: 99 mmol/L (ref 96–106)
Creatinine, Ser: 0.84 mg/dL (ref 0.57–1.00)
Globulin, Total: 3 g/dL (ref 1.5–4.5)
Glucose: 90 mg/dL (ref 70–99)
Potassium: 4 mmol/L (ref 3.5–5.2)
Sodium: 138 mmol/L (ref 134–144)
Total Protein: 7.3 g/dL (ref 6.0–8.5)
eGFR: 84 mL/min/{1.73_m2} (ref >59)

## 2022-06-25 LAB — LIPID PANEL
Chol/HDL Ratio: 5.3 ratio — ABNORMAL HIGH (ref 0.0–4.4)
Cholesterol, Total: 187 mg/dL (ref 100–199)
HDL: 35 mg/dL — ABNORMAL LOW (ref >39)
LDL Chol Calc (NIH): 140 mg/dL — ABNORMAL HIGH (ref 0–99)
Triglycerides: 65 mg/dL (ref 0–149)
VLDL Cholesterol Cal: 12 mg/dL (ref 5–40)

## 2022-06-25 LAB — VITAMIN D 25 HYDROXY (VIT D DEFICIENCY, FRACTURES): Vit D, 25-Hydroxy: 29.6 ng/mL — ABNORMAL LOW (ref 30.0–100.0)

## 2022-06-25 LAB — HEMOGLOBIN A1C
Est. average glucose Bld gHb Est-mCnc: 128 mg/dL
Hgb A1c MFr Bld: 6.1 % — ABNORMAL HIGH (ref 4.8–5.6)

## 2022-06-25 MED ORDER — METFORMIN HCL ER 500 MG PO TB24: ORAL_TABLET | ORAL | 3 refills | Status: DC

## 2022-06-25 MED ORDER — METOPROLOL SUCCINATE ER 25 MG PO TB24: 25.0000 mg | ORAL_TABLET | Freq: Every day | ORAL | 3 refills | Status: DC

## 2022-06-25 MED ORDER — ROSUVASTATIN CALCIUM 10 MG PO TABS: 10.0000 mg | ORAL_TABLET | Freq: Every day | ORAL | 3 refills | Status: DC

## 2022-06-25 MED ORDER — TELMISARTAN-HCTZ 40-12.5 MG PO TABS: 1.0000 | ORAL_TABLET | Freq: Every day | ORAL | 3 refills | Status: DC

## 2022-06-25 MED ORDER — VITAMIN D 50 MCG (2000 UT) PO CAPS: 1.0000 | ORAL_CAPSULE | Freq: Every day | ORAL | 3 refills | Status: DC

## 2022-06-28 ENCOUNTER — Telehealth: Payer: Self-pay | Admitting: Medical

## 2022-06-28 ENCOUNTER — Other Ambulatory Visit: Payer: Self-pay | Admitting: Medical

## 2022-06-28 MED ORDER — VALSARTAN-HYDROCHLOROTHIAZIDE 160-12.5 MG PO TABS: 1.0000 | ORAL_TABLET | Freq: Every day | ORAL | 3 refills | Status: DC

## 2022-06-28 NOTE — Telephone Encounter (Signed)
Telmisartan HCT apparently no longer covered by your insurance.  I sent an alternate called valsartan HCT today.  This will replace the Telmisartan HCT

## 2022-06-28 NOTE — Telephone Encounter (Signed)
Left message for pt to call me back 

## 2022-06-28 NOTE — Telephone Encounter (Signed)
Pt was notified.  

## 2022-06-29 ENCOUNTER — Encounter: Payer: Self-pay | Admitting: Internal Medicine

## 2022-07-09 LAB — HM COLONOSCOPY

## 2022-08-04 ENCOUNTER — Encounter: Payer: Self-pay | Admitting: Internal Medicine

## 2022-09-07 ENCOUNTER — Encounter: Payer: Self-pay | Admitting: Internal Medicine

## 2022-09-23 ENCOUNTER — Ambulatory Visit: Payer: Commercial Managed Care - PPO | Admitting: Medical

## 2022-11-29 DIAGNOSIS — E119 Type 2 diabetes mellitus without complications: Secondary | ICD-10-CM

## 2022-11-29 HISTORY — DX: Type 2 diabetes mellitus without complications: E11.9

## 2023-03-18 LAB — HM PAP SMEAR

## 2023-03-18 LAB — RESULTS CONSOLE HPV: CHL HPV: NEGATIVE

## 2023-04-07 ENCOUNTER — Other Ambulatory Visit: Payer: Self-pay | Admitting: Obstetrics and Gynecology

## 2023-04-07 DIAGNOSIS — N6310 Unspecified lump in the right breast, unspecified quadrant: Secondary | ICD-10-CM

## 2023-06-24 ENCOUNTER — Other Ambulatory Visit: Payer: 59

## 2023-06-25 ENCOUNTER — Other Ambulatory Visit: Payer: Self-pay | Admitting: Medical

## 2023-06-30 ENCOUNTER — Ambulatory Visit (INDEPENDENT_AMBULATORY_CARE_PROVIDER_SITE_OTHER): Payer: Commercial Managed Care - PPO | Admitting: Medical

## 2023-06-30 ENCOUNTER — Encounter: Payer: Self-pay | Admitting: Medical

## 2023-06-30 VITALS — BP 124/64 | HR 64 | Ht 62.0 in | Wt 157.8 lb

## 2023-06-30 DIAGNOSIS — R7303 Prediabetes: Secondary | ICD-10-CM

## 2023-06-30 DIAGNOSIS — E559 Vitamin D deficiency, unspecified: Secondary | ICD-10-CM

## 2023-06-30 DIAGNOSIS — Z7185 Encounter for immunization safety counseling: Secondary | ICD-10-CM | POA: Diagnosis not present

## 2023-06-30 DIAGNOSIS — J4520 Mild intermittent asthma, uncomplicated: Secondary | ICD-10-CM

## 2023-06-30 DIAGNOSIS — J45909 Unspecified asthma, uncomplicated: Secondary | ICD-10-CM | POA: Insufficient documentation

## 2023-06-30 DIAGNOSIS — I10 Essential (primary) hypertension: Secondary | ICD-10-CM

## 2023-06-30 DIAGNOSIS — R4589 Other symptoms and signs involving emotional state: Secondary | ICD-10-CM | POA: Insufficient documentation

## 2023-06-30 DIAGNOSIS — E282 Polycystic ovarian syndrome: Secondary | ICD-10-CM

## 2023-06-30 DIAGNOSIS — Z0000 Encounter for general adult medical examination without abnormal findings: Secondary | ICD-10-CM

## 2023-06-30 LAB — POCT URINALYSIS DIP (PROADVANTAGE DEVICE)
Bilirubin, UA: NEGATIVE
Blood, UA: NEGATIVE
Glucose, UA: NEGATIVE mg/dL
Ketones, POC UA: NEGATIVE mg/dL
Nitrite, UA: NEGATIVE
Protein Ur, POC: NEGATIVE mg/dL
Specific Gravity, Urine: 1.015
Urobilinogen, Ur: NEGATIVE
pH, UA: 6.5 (ref 5.0–8.0)

## 2023-06-30 LAB — LIPID PANEL

## 2023-06-30 LAB — CBC
Hematocrit: 42.4 % (ref 34.0–46.6)
Hemoglobin: 13.4 g/dL (ref 11.1–15.9)
MCH: 29.2 pg (ref 26.6–33.0)
MCHC: 31.6 g/dL (ref 31.5–35.7)
MCV: 92 fL (ref 79–97)
Platelets: 252 10*3/uL (ref 150–450)
RBC: 4.59 x10E6/uL (ref 3.77–5.28)
RDW: 13.2 % (ref 11.7–15.4)
WBC: 7.4 10*3/uL (ref 3.4–10.8)

## 2023-06-30 LAB — COMPREHENSIVE METABOLIC PANEL

## 2023-06-30 LAB — TSH

## 2023-06-30 LAB — VITAMIN D 25 HYDROXY (VIT D DEFICIENCY, FRACTURES)

## 2023-06-30 LAB — HEMOGLOBIN A1C

## 2023-06-30 MED ORDER — VENLAFAXINE HCL ER 37.5 MG PO CP24: 37.5000 mg | ORAL_CAPSULE | Freq: Every day | ORAL | 1 refills | Status: DC

## 2023-06-30 MED ORDER — VALSARTAN-HYDROCHLOROTHIAZIDE 160-12.5 MG PO TABS: 1.0000 | ORAL_TABLET | Freq: Every day | ORAL | 3 refills | Status: DC

## 2023-06-30 MED ORDER — ROSUVASTATIN CALCIUM 10 MG PO TABS: 10.0000 mg | ORAL_TABLET | Freq: Every day | ORAL | 3 refills | Status: DC

## 2023-06-30 MED ORDER — METOPROLOL SUCCINATE ER 25 MG PO TB24: 25.0000 mg | ORAL_TABLET | Freq: Every day | ORAL | 3 refills | Status: DC

## 2023-06-30 NOTE — Patient Instructions (Signed)
This visit was a preventative care visit, also known as wellness visit or routine physical.   Topics typically include healthy lifestyle, diet, exercise, preventative care, vaccinations, sick and well care, proper use of emergency dept and after hours care, as well as other concerns.     Recommendations: Continue to return yearly for your annual wellness and preventative care visits.  This gives [place] a chance to discuss healthy lifestyle, exercise, vaccinations, review your chart record, and perform screenings where appropriate.  I recommend you see your eye doctor yearly for routine vision care.  I recommend you see your dentist yearly for routine dental care including hygiene visits twice yearly.  See your gynecologist yearly for routine gynecological care.   Vaccination recommendations were reviewed Immunization History  Administered Date(s) Administered   Influenza,inj,Quad PF,6+ Mos 08/09/2017, 08/17/2018, 09/26/2019, 08/12/2021   PFIZER(Purple Top)SARS-COV-2 Vaccination 12/30/2019, 02/15/2020, 03/07/2020   Td 12/14/2011   Tdap 06/24/2022    Shingles vaccine:  I recommend you have a shingles vaccine to help prevent shingles or herpes zoster outbreak.   Please call your insurer to inquire about coverage for the Shingrix vaccine given in 2 doses.   Some insurers cover this vaccine after age 6, some cover this after age 79.  If your insurer covers this, then call to schedule appointment to have this vaccine here.   Screening for cancer: Colon cancer screening: Reviewed 06/2022 colonoscopy, due repeat in 10 years  Reviewed 2021 pap and 2023 breast imaging.  She notes recent 2024 pap with gyn  Skin cancer screening: Check your skin regularly for new changes, growing lesions, or other lesions of concern Come in for evaluation if you have skin lesions of concern.  Lung cancer screening: If you have a greater than 20 pack year history of tobacco use, then you may qualify for lung  cancer screening with a chest CT scan.   Please call your insurance company to inquire about coverage for this test.  We currently don't have screenings for other cancers besides breast, cervical, colon, and lung cancers.  If you have a strong family history of cancer or have other cancer screening concerns, please let me know.    Bone health: Get at least 150 minutes of aerobic exercise weekly Get weight bearing exercise at least once weekly Bone density test:  A bone density test is an imaging test that uses a type of X-ray to measure the amount of calcium and other minerals in your bones. The test may be used to diagnose or screen you for a condition that causes weak or thin bones (osteoporosis), predict your risk for a broken bone (fracture), or determine how well your osteoporosis treatment is working. The bone density test is recommended for females 65 and older, or females or males <65 if certain risk factors such as thyroid disease, long term use of steroids such as for asthma or rheumatological issues, vitamin D deficiency, estrogen deficiency, family history of osteoporosis, self or family history of fragility fracture in first degree relative.    Heart health: Get at least 150 minutes of aerobic exercise weekly Limit alcohol It is important to maintain a healthy blood pressure and healthy cholesterol numbers  Heart disease screening: Screening for heart disease includes screening for blood pressure, fasting lipids, glucose/diabetes screening, BMI height to weight ratio, reviewed of smoking status, physical activity, and diet.    Goals include blood pressure 120/80 or less, maintaining a healthy lipid/cholesterol profile, preventing diabetes or keeping diabetes numbers under good  control, not smoking or using tobacco products, exercising most days per week or at least 150 minutes per week of exercise, and eating healthy variety of fruits and vegetables, healthy oils, and avoiding  unhealthy food choices like fried food, fast food, high sugar and high cholesterol foods.      Medical care options: I recommend you continue to seek care here first for routine care.  We try really hard to have available appointments Monday through Friday daytime hours for sick visits, acute visits, and physicals.  Urgent care should be used for after hours and weekends for significant issues that cannot wait till the next day.  The emergency department should be used for significant potentially life-threatening emergencies.  The emergency department is expensive, can often have long wait times for less significant concerns, so try to utilize primary care, urgent care, or telemedicine when possible to avoid unnecessary trips to the emergency department.  Virtual visits and telemedicine have been introduced since the pandemic started in 2020, and can be convenient ways to receive medical care.  We offer virtual appointments as well to assist you in a variety of options to seek medical care.    Separate significant issues discussed: Hypertension-continue current medication Toprol XL 25mg  daily and Valsartan HcT 160/12.5mg  daily  Hyperlipidemia - continue crestor 10mg  daily, labs today  Prediabetes, impaired glucose- continue Metformin XR 500mg  daily   PCOS - on metformin, sees gynecology  Vit D deficiency - continue vit D 2000 u daily, labs today  Depressed mood - counseled on diet, exercise, goal setting, sleep, continue with counseling.  Begin trial of Effexor. Discussed risks/benefits and proper use of medication.  Reactive airway - recent televisit for this. PFT today. Discussed diagnosis, possible treatment going forward such as prn inhaler, maintenance medicaiton

## 2023-06-30 NOTE — Progress Notes (Signed)
Subjective:   HPI  Anne Bender is a 52 y.o. female who presents for Chief Complaint  Patient presents with   Annual Exam    Fasting cpe, feeling like she is depression- was seeing a therapist but didn't feel it hurts     Patient Care Team: Bettylee Feig, Cleda Mccreedy as PCP - General (Family Medicine) Ob/Gyn, [facility] (Obstetrics and Gynecology) Sees dentist Sees eye doctor Dr. Charna Elizabeth, GI Gynecology, Dr. Madilyn Hook   Concerns: Here for physical.  Doing well, no particular issues.  Hypertension-compliant with metoprolol 25 mg XL daily along with valsartan HCT 160/12.5 mg daily  Hyperlipidemia-compliant with Crestor 10 mg daily without complaint  Prediabetes and PCOS-compliant with metformin XR 500 mg daily.  Metformin makes her nausea.  No diarrhea.    Vitamin D deficiency-compliant with vitamin D 2000 units daily  Exercise - walking.   No weight bearing exercise.    For past 5 months, having some issues with depressed mood. Been seeing counselor for several months. Doesn't feel like she can snap out of this mood.  Wonders if she needs medication.  She notes some mood related eating, emotional eating.   Symptoms include anhedonia, overeating, lack of motivation, agitated often.  Doesn't sleep very well due to dog having to go to bathroom in middle of the night.   Otherwise sleep is ok.  Prior medicaiton trials in past included prozac.    LMP 2 years.  She notes recent televisit for cough, congestion wheezing.  Diagnosed with mild asthma,   she notes no hx/o childhood asthma but has had to occasionally use inhalers in recent years.  Has some residual cough currently.    Reviewed their medical, surgical, family, social, medication, and allergy history and updated chart as appropriate.  Past Medical History:  Diagnosis Date   Alkaline phosphatase elevation 2019   isoenzymes normal, presumed related to vit D deficiency   Blood transfusion without reported  diagnosis    with prior gynecology surgery   Hyperlipidemia    Hypertension 2010   Impaired fasting blood sugar 2019   Wears glasses     Family History  Problem Relation Age of Onset   Stroke Mother 78   Hypertension Mother    Heart disease Mother 28       pacemaker, CHF   Pulmonary embolism Father    Hypertension Father    Peripheral vascular disease Father    Hypertension Sister    Hypertension Brother    Diabetes Paternal Grandmother    Hypertension Paternal Grandmother    Cancer Paternal Grandmother        breast   Cancer Maternal Aunt        breast   Hypertension Brother      Current Outpatient Medications:    Cholecalciferol (VITAMIN D3) 50 MCG (2000 UT) capsule, TAKE 1 CAPSULE BY MOUTH EVERY DAY, Disp: 90 capsule, Rfl: 0   metFORMIN (GLUCOPHAGE-XR) 500 MG 24 hr tablet, TAKE 1 TABLET BY MOUTH EVERY DAY WITH BREAKFAST, Disp: 90 tablet, Rfl: 3   venlafaxine XR (EFFEXOR XR) 37.5 MG 24 hr capsule, Take 1 capsule (37.5 mg total) by mouth daily with breakfast., Disp: 30 capsule, Rfl: 1   metoprolol succinate (TOPROL-XL) 25 MG 24 hr tablet, Take 1 tablet (25 mg total) by mouth daily., Disp: 90 tablet, Rfl: 3   rosuvastatin (CRESTOR) 10 MG tablet, Take 1 tablet (10 mg total) by mouth daily., Disp: 90 tablet, Rfl: 3   valsartan-hydrochlorothiazide (DIOVAN HCT) 160-12.5  MG tablet, Take 1 tablet by mouth daily., Disp: 90 tablet, Rfl: 3  Allergies  Allergen Reactions   Phentermine     Insomnia     Review of Systems  Constitutional:  Negative for chills, fever, malaise/fatigue and weight loss.  HENT:  Negative for congestion, ear pain, hearing loss, sore throat and tinnitus.   Eyes:  Negative for blurred vision, pain and redness.  Respiratory:  Positive for cough and wheezing. Negative for hemoptysis and shortness of breath.   Cardiovascular:  Negative for chest pain, palpitations, orthopnea, claudication and leg swelling.  Gastrointestinal:  Negative for abdominal pain,  blood in stool, constipation, diarrhea, nausea and vomiting.  Genitourinary:  Negative for dysuria, flank pain, frequency, hematuria and urgency.  Musculoskeletal:  Negative for falls, joint pain and myalgias.  Skin:  Negative for itching and rash.  Neurological:  Negative for dizziness, tingling, speech change, weakness and headaches.  Endo/Heme/Allergies:  Negative for polydipsia. Does not bruise/bleed easily.  Psychiatric/Behavioral:  Positive for depression. Negative for memory loss. The patient is not nervous/anxious and does not have insomnia.          06/30/2023    8:26 AM 06/24/2022   10:39 AM 08/12/2021    3:00 PM 05/06/2021    8:34 AM 01/09/2020    9:21 AM  Depression screen PHQ 2/9  Decreased Interest 2 0 0 1 3  Down, Depressed, Hopeless 2 0 0 1 1  PHQ - 2 Score 4 0 0 2 4  Altered sleeping 3   1 1   Tired, decreased energy 3   1 1   Change in appetite 3   1 3   Feeling bad or failure about yourself  3   0 3  Trouble concentrating 3   0 2  Moving slowly or fidgety/restless 0   0 0  Suicidal thoughts 0   0 0  PHQ-9 Score 19   5 14   Difficult doing work/chores Somewhat difficult   Somewhat difficult Very difficult       Objective:  BP 124/64   Pulse 64   Ht 5\' 2"  (1.575 m)   Wt 157 lb 12.8 oz (71.6 kg)   LMP 12/27/2016   BMI 28.86 kg/m   Wt Readings from Last 3 Encounters:  06/30/23 157 lb 12.8 oz (71.6 kg)  06/24/22 144 lb 3.2 oz (65.4 kg)  08/12/21 148 lb (67.1 kg)   BP Readings from Last 3 Encounters:  06/30/23 124/64  06/24/22 120/80  08/12/21 120/70   General appearance: alert, no distress, WD/WN, African American female Skin: unremarkable HEENT: normocephalic, conjunctiva/corneas normal, sclerae anicteric, PERRLA, EOMi, nares patent, no discharge or erythema, pharynx normal Neck: supple, no lymphadenopathy, no thyromegaly, no masses, normal ROM, no bruits Chest: non tender, normal shape and expansion Heart: RRR, normal S1, S2, no murmurs Lungs: CTA  bilaterally, no wheezes, rhonchi, or rales Abdomen: +bs, soft, non tender, non distended, no masses, no hepatomegaly, no splenomegaly, no bruits Back: non tender, normal ROM, no scoliosis Musculoskeletal: upper extremities non tender, no obvious deformity, normal ROM throughout, lower extremities non tender, no obvious deformity, normal ROM throughout Extremities: no edema, no cyanosis, no clubbing Pulses: 2+ symmetric, upper and lower extremities, normal cap refill Neurological: alert, oriented x 3, CN2-12 intact, strength normal upper extremities and lower extremities, sensation normal throughout, DTRs 2+ throughout, no cerebellar signs, gait normal Psychiatric: normal affect, behavior normal, pleasant  Breast/gyn/rectal - deferred to gynecology   Assessment and Plan :   Encounter Diagnoses  Name  Primary?   Encounter for health maintenance examination in adult Yes   Vitamin D deficiency    Prediabetes    PCOS (polycystic ovarian syndrome)    Essential hypertension, benign    Vaccine counseling    Depressed mood    Mild intermittent reactive airway disease without complication     This visit was a preventative care visit, also known as wellness visit or routine physical.   Topics typically include healthy lifestyle, diet, exercise, preventative care, vaccinations, sick and well care, proper use of emergency dept and after hours care, as well as other concerns.     Recommendations: Continue to return yearly for your annual wellness and preventative care visits.  This gives [place] a chance to discuss healthy lifestyle, exercise, vaccinations, review your chart record, and perform screenings where appropriate.  I recommend you see your eye doctor yearly for routine vision care.  I recommend you see your dentist yearly for routine dental care including hygiene visits twice yearly.  See your gynecologist yearly for routine gynecological care.   Vaccination recommendations were  reviewed Immunization History  Administered Date(s) Administered   Influenza,inj,Quad PF,6+ Mos 08/09/2017, 08/17/2018, 09/26/2019, 08/12/2021   PFIZER(Purple Top)SARS-COV-2 Vaccination 12/30/2019, 02/15/2020, 03/07/2020   Td 12/14/2011   Tdap 06/24/2022    Shingles vaccine:  I recommend you have a shingles vaccine to help prevent shingles or herpes zoster outbreak.   Please call your insurer to inquire about coverage for the Shingrix vaccine given in 2 doses.   Some insurers cover this vaccine after age 40, some cover this after age 2.  If your insurer covers this, then call to schedule appointment to have this vaccine here.   Screening for cancer: Colon cancer screening: Reviewed 06/2022 colonoscopy, due repeat in 10 years  Reviewed 2021 pap and 2023 breast imaging.  She notes recent 2024 pap with gyn  Skin cancer screening: Check your skin regularly for new changes, growing lesions, or other lesions of concern Come in for evaluation if you have skin lesions of concern.  Lung cancer screening: If you have a greater than 20 pack year history of tobacco use, then you may qualify for lung cancer screening with a chest CT scan.   Please call your insurance company to inquire about coverage for this test.  We currently don't have screenings for other cancers besides breast, cervical, colon, and lung cancers.  If you have a strong family history of cancer or have other cancer screening concerns, please let me know.    Bone health: Get at least 150 minutes of aerobic exercise weekly Get weight bearing exercise at least once weekly Bone density test:  A bone density test is an imaging test that uses a type of X-ray to measure the amount of calcium and other minerals in your bones. The test may be used to diagnose or screen you for a condition that causes weak or thin bones (osteoporosis), predict your risk for a broken bone (fracture), or determine how well your osteoporosis treatment is  working. The bone density test is recommended for females 65 and older, or females or males <65 if certain risk factors such as thyroid disease, long term use of steroids such as for asthma or rheumatological issues, vitamin D deficiency, estrogen deficiency, family history of osteoporosis, self or family history of fragility fracture in first degree relative.    Heart health: Get at least 150 minutes of aerobic exercise weekly Limit alcohol It is important to maintain a healthy blood pressure  and healthy cholesterol numbers  Heart disease screening: Screening for heart disease includes screening for blood pressure, fasting lipids, glucose/diabetes screening, BMI height to weight ratio, reviewed of smoking status, physical activity, and diet.    Goals include blood pressure 120/80 or less, maintaining a healthy lipid/cholesterol profile, preventing diabetes or keeping diabetes numbers under good control, not smoking or using tobacco products, exercising most days per week or at least 150 minutes per week of exercise, and eating healthy variety of fruits and vegetables, healthy oils, and avoiding unhealthy food choices like fried food, fast food, high sugar and high cholesterol foods.      Medical care options: I recommend you continue to seek care here first for routine care.  We try really hard to have available appointments Monday through Friday daytime hours for sick visits, acute visits, and physicals.  Urgent care should be used for after hours and weekends for significant issues that cannot wait till the next day.  The emergency department should be used for significant potentially life-threatening emergencies.  The emergency department is expensive, can often have long wait times for less significant concerns, so try to utilize primary care, urgent care, or telemedicine when possible to avoid unnecessary trips to the emergency department.  Virtual visits and telemedicine have been introduced  since the pandemic started in 2020, and can be convenient ways to receive medical care.  We offer virtual appointments as well to assist you in a variety of options to seek medical care.    Separate significant issues discussed: Hypertension-continue current medication Toprol XL 25mg  daily and Valsartan HcT 160/12.5mg  daily  Hyperlipidemia - continue crestor 10mg  daily, labs today  Prediabetes, impaired glucose- continue Metformin XR 500mg  daily   PCOS - on metformin, sees gynecology  Vit D deficiency - continue vit D 2000 u daily, labs today  Depressed mood - counseled on diet, exercise, goal setting, sleep, continue with counseling.  Begin trial of Effexor. Discussed risks/benefits and proper use of medication.  Reactive airway - recent televisit for this. PFT today. Discussed diagnosis, possible treatment going forward such as prn inhaler, maintenance medicaiton   Amar was seen today for annual exam.  Diagnoses and all orders for this visit:  Encounter for health maintenance examination in adult -     Comprehensive metabolic panel -     CBC -     Lipid panel -     VITAMIN D 25 Hydroxy (Vit-D Deficiency, Fractures) -     POCT Urinalysis DIP (Proadvantage Device) -     Hemoglobin A1c -     TSH  Vitamin D deficiency -     VITAMIN D 25 Hydroxy (Vit-D Deficiency, Fractures)  Prediabetes -     Hemoglobin A1c  PCOS (polycystic ovarian syndrome)  Essential hypertension, benign -     Comprehensive metabolic panel  Vaccine counseling  Depressed mood -     TSH  Mild intermittent reactive airway disease without complication -     Spirometry with Graph  Other orders -     venlafaxine XR (EFFEXOR XR) 37.5 MG 24 hr capsule; Take 1 capsule (37.5 mg total) by mouth daily with breakfast. -     metoprolol succinate (TOPROL-XL) 25 MG 24 hr tablet; Take 1 tablet (25 mg total) by mouth daily. -     valsartan-hydrochlorothiazide (DIOVAN HCT) 160-12.5 MG tablet; Take 1 tablet by  mouth daily. -     rosuvastatin (CRESTOR) 10 MG tablet; Take 1 tablet (10 mg total) by mouth  daily.     Follow-up pending labs, yearly for physical

## 2023-07-01 ENCOUNTER — Other Ambulatory Visit: Payer: Self-pay | Admitting: Medical

## 2023-07-01 MED ORDER — RYBELSUS 3 MG PO TABS: 3.0000 mg | ORAL_TABLET | Freq: Every day | ORAL | 0 refills | Status: DC

## 2023-07-01 NOTE — Progress Notes (Signed)
Unfortunately your labs are now in the diabetic range.  Blood sugar over 126 and hemoglobin A1c over 6.5%  Kidney and electrolytes and liver are normal.  Cholesterol overall looks okay.  Blood counts normal.  Vitamin D looks good.  Thyroid okay.  I recommend trying a medicine such as Rybelsus or Ozempic.  These types of medicines help control blood sugar, help with weight loss.  They can cause nausea constipation or acid reflux.  However I think he will tolerate this better than the metformin.  I will send these to the pharmacy to try.  Depends on if insurance will cover these.  Whichever 1 is covered we start with a low-dose for the first month then increase dose each month until we get to the preferred dose.  Rybelsus is a tablet or as Ozempic is an injectable.  This is a tiny needle but does not hurt but the medication works really well  I recommend 4 to 6 weeks follow-up after starting the medication to discuss diabetes in greater detail  Begin the Effexor new medication for mood as discussed

## 2023-07-05 ENCOUNTER — Other Ambulatory Visit: Payer: Self-pay

## 2023-07-15 ENCOUNTER — Ambulatory Visit
Admission: RE | Admit: 2023-07-15 | Discharge: 2023-07-15 | Disposition: A | Discharge status: Home / Self Care | Payer: Commercial Managed Care - PPO | Source: Ambulatory Visit | Attending: Obstetrics and Gynecology | Admitting: Obstetrics and Gynecology

## 2023-07-15 ENCOUNTER — Ambulatory Visit
Admission: RE | Admit: 2023-07-15 | Discharge: 2023-07-15 | Disposition: A | Discharge status: Home / Self Care | Payer: Self-pay | Source: Ambulatory Visit | Attending: Obstetrics and Gynecology | Admitting: Obstetrics and Gynecology

## 2023-07-15 DIAGNOSIS — N6310 Unspecified lump in the right breast, unspecified quadrant: Secondary | ICD-10-CM

## 2023-08-05 ENCOUNTER — Other Ambulatory Visit: Payer: Self-pay | Admitting: Medical

## 2023-09-01 ENCOUNTER — Other Ambulatory Visit: Payer: Self-pay | Admitting: Medical

## 2023-10-13 ENCOUNTER — Telehealth: Payer: Self-pay | Admitting: Medical

## 2023-10-13 MED ORDER — RYBELSUS 3 MG PO TABS: 1.0000 | ORAL_TABLET | Freq: Every day | ORAL | 1 refills | Status: DC

## 2023-10-13 NOTE — Telephone Encounter (Signed)
Sent in medication

## 2023-10-13 NOTE — Telephone Encounter (Signed)
Pt is needing to switch pharmacies for her rybelsus to [facility] - [place], [place] - [address] AT [address] [facility] [employer]

## 2023-10-21 ENCOUNTER — Telehealth: Payer: Self-pay | Admitting: Medical

## 2023-10-21 NOTE — Telephone Encounter (Signed)
Pt left message that Rybelsus needs P.A.

## 2023-10-22 NOTE — Telephone Encounter (Signed)
P.A. Myer Peer

## 2023-10-29 NOTE — Telephone Encounter (Signed)
Recvd fax [employer] does not manage P.A for this plan

## 2023-11-09 ENCOUNTER — Telehealth: Payer: Self-pay

## 2023-11-09 ENCOUNTER — Other Ambulatory Visit (HOSPITAL_COMMUNITY): Payer: Self-pay

## 2023-11-09 ENCOUNTER — Other Ambulatory Visit: Payer: Self-pay | Admitting: Medical

## 2023-11-09 MED ORDER — RYBELSUS 7 MG PO TABS: 1.0000 | ORAL_TABLET | Freq: Every day | ORAL | 0 refills | Status: DC

## 2023-11-09 NOTE — Telephone Encounter (Signed)
Pharmacy Patient Advocate Encounter   Received notification from Physician's Office that prior authorization for RYBELUS 7MG  is required/requested.   Insurance verification completed.   The patient is insured through [facility] .   Per test claim: PA required; PA submitted to above mentioned insurance via CoverMyMeds Key/confirmation #/EOC (Key: [id number])           Status is pending

## 2023-11-10 ENCOUNTER — Other Ambulatory Visit (HOSPITAL_COMMUNITY): Payer: Self-pay

## 2023-11-10 NOTE — Telephone Encounter (Signed)
Pharmacy Patient Advocate Encounter  Received notification from [facility] that Prior Authorization for Rybelsus 7mg  has been APPROVED from 12.11.24 to 12.11.27. Ran test claim, Rx has been filled and is payable again on/after 1.5.25.   This test claim was processed through [facility]- copay amounts may vary at other pharmacies due to pharmacy/plan contracts, or as the patient moves through the different stages of their insurance plan.   PA #/Case ID/Reference #: (Key: [id number])

## 2023-11-12 NOTE — Telephone Encounter (Signed)
Rybelsus 7mg  has been APPROVED from 12.11.24 to 12.11.27.

## 2023-12-16 ENCOUNTER — Other Ambulatory Visit: Payer: Self-pay | Admitting: Medical

## 2023-12-16 ENCOUNTER — Telehealth: Payer: Self-pay | Admitting: Medical

## 2023-12-16 MED ORDER — RYBELSUS 3 MG PO TABS: 3.0000 mg | ORAL_TABLET | Freq: Every day | ORAL | 2 refills | Status: DC

## 2023-12-16 NOTE — Telephone Encounter (Signed)
Pt called and states the Rybelsus dosage she is on now is making her sick and is asking if she can go back to 3 mg.

## 2024-02-06 ENCOUNTER — Other Ambulatory Visit: Payer: Self-pay | Admitting: Medical

## 2024-02-06 ENCOUNTER — Ambulatory Visit (INDEPENDENT_AMBULATORY_CARE_PROVIDER_SITE_OTHER): Payer: Self-pay | Admitting: Medical

## 2024-02-06 ENCOUNTER — Encounter: Payer: Self-pay | Admitting: Medical

## 2024-02-06 VITALS — BP 124/80 | HR 67 | Wt 141.0 lb

## 2024-02-06 DIAGNOSIS — E1165 Type 2 diabetes mellitus with hyperglycemia: Secondary | ICD-10-CM | POA: Diagnosis not present

## 2024-02-06 DIAGNOSIS — E785 Hyperlipidemia, unspecified: Secondary | ICD-10-CM

## 2024-02-06 DIAGNOSIS — R748 Abnormal levels of other serum enzymes: Secondary | ICD-10-CM

## 2024-02-06 DIAGNOSIS — F324 Major depressive disorder, single episode, in partial remission: Secondary | ICD-10-CM

## 2024-02-06 DIAGNOSIS — E559 Vitamin D deficiency, unspecified: Secondary | ICD-10-CM

## 2024-02-06 DIAGNOSIS — I10 Essential (primary) hypertension: Secondary | ICD-10-CM | POA: Diagnosis not present

## 2024-02-06 LAB — POCT GLYCOSYLATED HEMOGLOBIN (HGB A1C): Hemoglobin A1C: 5.8 % — AB (ref 4.0–5.6)

## 2024-02-06 NOTE — Patient Instructions (Addendum)
 Diabetes Your hemoglobin A1c is much improved down to 5.8% See your eye doctor yearly and make sure they are aware of the diabetes diagnosis to do a diabetic eye exam.  Make sure they send [place] a copy of the note Consider getting flu shot yearly You can check your blood sugars particularly if you still low sugar such as sweaty or clammy or lightheaded Goal is blood sugar between 80 and 130 fasting before meals Exercise regularly Continue healthy diet Check sugars, if any readings under 70, then lets hold off on using Rybelsus for now If not, you can continue Rybelsus 3mg  for now Let me know in the next 2 weeks  Depressed mood-depression in partial remission, doing okay on Effexor.  Vitamin D deficiency-continue supplement  Hypertension Continue Toprol-XL 25 mg daily, continue valsartan HCT 160/12.5 mg daily  Hyperlipidemia Continue rosuvastatin Crestor 10 mg daily

## 2024-02-06 NOTE — Progress Notes (Signed)
 Subjective:  Anne Bender is a 53 y.o. female who presents for Chief Complaint  Patient presents with   Medical Management of Chronic Issues    Med check- new diabetic. Follow-up on medication, declines vaccines today     Here for med check  Hypertension-compliant with valsartan HCT 160/12.5 mg daily and Toprol-XL 25 mg daily  Hyperlipidemia-compliant with Crestor 10 mg daily without complaint  Diabetes new diagnosis follow-up 2024.  She is compliant with Rybelsus 3 mg daily.  She was supposed to come back about a month after that visit but has not made it back till now.  Had tried the 7mg  dose but this made her nauseated, dizzy and didn't feel good on it.    She did begin Effexor last visit to help with mood.  Compliant with Effexor XR 37.5 mg daily  No other aggravating or relieving factors.    No other c/o.  Past Medical History:  Diagnosis Date   Alkaline phosphatase elevation 2019   isoenzymes normal, presumed related to vit D deficiency   Blood transfusion without reported diagnosis    with prior gynecology surgery   Diabetes mellitus, type 2 (HCC) 2024   Hyperlipidemia    Hypertension 2010   Impaired fasting blood sugar 2019   Wears glasses    Current Outpatient Medications on File Prior to Visit  Medication Sig Dispense Refill   Cholecalciferol (VITAMIN D3) 50 MCG (2000 UT) capsule TAKE 1 CAPSULE BY MOUTH EVERY DAY 90 capsule 0   metoprolol succinate (TOPROL-XL) 25 MG 24 hr tablet Take 1 tablet (25 mg total) by mouth daily. 90 tablet 3   rosuvastatin (CRESTOR) 10 MG tablet Take 1 tablet (10 mg total) by mouth daily. 90 tablet 3   Semaglutide (RYBELSUS) 3 MG TABS Take 1 tablet (3 mg total) by mouth daily. 30 tablet 2   valsartan-hydrochlorothiazide (DIOVAN HCT) 160-12.5 MG tablet Take 1 tablet by mouth daily. 90 tablet 3   venlafaxine XR (EFFEXOR-XR) 37.5 MG 24 hr capsule TAKE 1 CAPSULE BY MOUTH DAILY WITH BREAKFAST. 30 capsule 5   No current facility-administered  medications on file prior to visit.     The following portions of the patient's history were reviewed and updated as appropriate: allergies, current medications, past family history, past medical history, past social history, past surgical history and problem list.  ROS Otherwise as in subjective above  Objective: BP 124/80   Pulse 67   Wt 141 lb (64 kg)   LMP 12/27/2016   BMI 25.79 kg/m   Wt Readings from Last 3 Encounters:  02/06/24 141 lb (64 kg)  06/30/23 157 lb 12.8 oz (71.6 kg)  06/24/22 144 lb 3.2 oz (65.4 kg)   BP Readings from Last 3 Encounters:  02/06/24 124/80  06/30/23 124/64  06/24/22 120/80    General appearance: alert, no distress, well developed, well nourished Neck: supple, no lymphadenopathy, no thyromegaly, no masses Heart: RRR, normal S1, S2, no murmurs Lungs: CTA bilaterally, no wheezes, rhonchi, or rales Pulses: 2+ radial pulses, 2+ pedal pulses, normal cap refill Ext: no edema  Diabetic Foot Exam - Simple   Simple Foot Form Visual Inspection No deformities, no ulcerations, no other skin breakdown bilaterally: Yes Sensation Testing Intact to touch and monofilament testing bilaterally: Yes Pulse Check Posterior Tibialis and Dorsalis pulse intact bilaterally: Yes Comments      Assessment: Encounter Diagnoses  Name Primary?   Type 2 diabetes mellitus with hyperglycemia, without long-term current use of insulin (HCC) Yes  Essential hypertension, benign    Vitamin D deficiency    Alkaline phosphatase elevation    Hyperlipidemia, unspecified hyperlipidemia type    Depression, major, single episode, in partial remission (HCC)      Plan: Diabetes Your hemoglobin A1c is much improved down to 5.8% See your eye doctor yearly and make sure they are aware of the diabetes diagnosis to do a diabetic eye exam.  Make sure they send [place] a copy of the note Consider getting flu shot yearly You can check your blood sugars particularly if you still low  sugar such as sweaty or clammy or lightheaded Goal is blood sugar between 80 and 130 fasting before meals Exercise regularly Continue healthy diet Check sugars, if any readings under 70, then lets hold off on using Rybelsus for now If not, you can continue Rybelsus 3mg  for now Let me know in the next 2 weeks  Depressed mood-depression in partial remission, doing okay on Effexor.  Vitamin D deficiency-continue supplement  Hypertension Continue Toprol-XL 25 mg daily, continue valsartan HCT 160/12.5 mg daily  Hyperlipidemia Continue rosuvastatin Crestor 10 mg daily  Anne Bender was seen today for medical management of chronic issues.  Diagnoses and all orders for this visit:  Type 2 diabetes mellitus with hyperglycemia, without long-term current use of insulin (HCC) -     Microalbumin/Creatinine Ratio, Urine -     HgB A1c  Essential hypertension, benign  Vitamin D deficiency  Alkaline phosphatase elevation  Hyperlipidemia, unspecified hyperlipidemia type  Depression, major, single episode, in partial remission (HCC)    Follow up: 54mo

## 2024-02-07 LAB — MICROALBUMIN / CREATININE URINE RATIO
Creatinine, Urine: 64.5 mg/dL
Microalb/Creat Ratio: <5 mg/g{creat} (ref 0–29)
Microalbumin, Urine: <3 ug/mL

## 2024-02-07 NOTE — Progress Notes (Signed)
 Results sent through MyChart

## 2024-02-20 ENCOUNTER — Other Ambulatory Visit: Payer: Self-pay | Admitting: Medical

## 2024-02-20 MED ORDER — RYBELSUS 7 MG PO TABS: 7.0000 mg | ORAL_TABLET | Freq: Every day | ORAL | 1 refills | Status: DC

## 2024-03-14 DIAGNOSIS — Z01411 Encounter for gynecological examination (general) (routine) with abnormal findings: Secondary | ICD-10-CM | POA: Diagnosis not present

## 2024-03-14 DIAGNOSIS — Z1231 Encounter for screening mammogram for malignant neoplasm of breast: Secondary | ICD-10-CM | POA: Diagnosis not present

## 2024-03-14 DIAGNOSIS — Z6824 Body mass index (BMI) 24.0-24.9, adult: Secondary | ICD-10-CM | POA: Diagnosis not present

## 2024-03-14 DIAGNOSIS — Z01419 Encounter for gynecological examination (general) (routine) without abnormal findings: Secondary | ICD-10-CM | POA: Diagnosis not present

## 2024-03-14 DIAGNOSIS — Z1239 Encounter for other screening for malignant neoplasm of breast: Secondary | ICD-10-CM | POA: Diagnosis not present

## 2024-03-14 LAB — HM MAMMOGRAPHY

## 2024-03-15 ENCOUNTER — Other Ambulatory Visit: Payer: Self-pay | Admitting: Medical

## 2024-03-17 ENCOUNTER — Other Ambulatory Visit: Payer: Self-pay | Admitting: Medical

## 2024-05-07 DIAGNOSIS — R102 Pelvic and perineal pain: Secondary | ICD-10-CM | POA: Diagnosis not present

## 2024-05-22 ENCOUNTER — Telehealth: Payer: Self-pay | Admitting: Family Medicine

## 2024-05-22 NOTE — Telephone Encounter (Signed)
 Copied from CRM [id number]. Topic: Appointments - Scheduling Inquiry for Clinic >> May 22, 2024  4:24 PM Graeme ORN wrote: Reason for CRM: Patient called. Starting new job and they need a health form completed by 6/30. Patient states she told new employer that her physical is already scheduled for August when its due. She would like to know if provider can complete form based on last physical. Patient will try to upload to fax form in. Thank you    ----------------------------------------------------------------------- From previous Reason for Contact - Scheduling: Patient/patient representative is calling to schedule an appointment. Refer to attachments for appointment information.

## 2024-05-22 NOTE — Telephone Encounter (Signed)
 Called pt reached voice mail advised we can fill out form based on last cpe since less than 1 year.  She can email or fax it to.

## 2024-05-24 DIAGNOSIS — Z111 Encounter for screening for respiratory tuberculosis: Secondary | ICD-10-CM | POA: Diagnosis not present

## 2024-05-26 DIAGNOSIS — Z111 Encounter for screening for respiratory tuberculosis: Secondary | ICD-10-CM | POA: Diagnosis not present

## 2024-06-02 ENCOUNTER — Other Ambulatory Visit: Payer: Self-pay | Admitting: Medical

## 2024-06-08 ENCOUNTER — Ambulatory Visit: Admitting: Medical

## 2024-06-08 ENCOUNTER — Encounter: Payer: Self-pay | Admitting: Medical

## 2024-06-08 VITALS — BP 118/72 | HR 76 | Wt 132.6 lb

## 2024-06-08 DIAGNOSIS — E559 Vitamin D deficiency, unspecified: Secondary | ICD-10-CM | POA: Diagnosis not present

## 2024-06-08 DIAGNOSIS — E1165 Type 2 diabetes mellitus with hyperglycemia: Secondary | ICD-10-CM | POA: Diagnosis not present

## 2024-06-08 DIAGNOSIS — I10 Essential (primary) hypertension: Secondary | ICD-10-CM

## 2024-06-08 DIAGNOSIS — E785 Hyperlipidemia, unspecified: Secondary | ICD-10-CM | POA: Diagnosis not present

## 2024-06-08 LAB — POCT GLYCOSYLATED HEMOGLOBIN (HGB A1C): Hemoglobin A1C: 5.8 % — AB (ref 4.0–5.6)

## 2024-06-08 MED ORDER — ROSUVASTATIN CALCIUM 10 MG PO TABS: 10.0000 mg | ORAL_TABLET | Freq: Every day | ORAL | 2 refills | Status: AC

## 2024-06-08 MED ORDER — METOPROLOL SUCCINATE ER 25 MG PO TB24: 25.0000 mg | ORAL_TABLET | Freq: Every day | ORAL | 2 refills | Status: AC

## 2024-06-08 MED ORDER — RYBELSUS 7 MG PO TABS: 7.0000 mg | ORAL_TABLET | Freq: Every day | ORAL | 2 refills | Status: AC

## 2024-06-08 MED ORDER — VENLAFAXINE HCL ER 37.5 MG PO CP24: 37.5000 mg | ORAL_CAPSULE | Freq: Every day | ORAL | 2 refills | Status: AC

## 2024-06-08 MED ORDER — VALSARTAN-HYDROCHLOROTHIAZIDE 160-12.5 MG PO TABS: 1.0000 | ORAL_TABLET | Freq: Every day | ORAL | 2 refills | Status: AC

## 2024-06-08 NOTE — Progress Notes (Signed)
 Subjective:  Anne Bender is a 53 y.o. female who presents for Chief Complaint  Patient presents with   other    4 month f/u on diabetes, no other issues, [facility] had mammo already and had eye exam 5/24 [facility] no eye exam scheduled until 06/29/24,      Diabetes - compliant with Rybelsus  7mg  daily.  Morning glucose running in the 90s.  Checking feet daily  HTN - compliant with medicaiton.  No issues.  Not checking blood pressures.  Hyperlipidemia - compliant with medicaiton, no issues.  She continues on vitamin D  supplement  No other aggravating or relieving factors.    No other c/o.  Past Medical History:  Diagnosis Date   Alkaline phosphatase elevation 2019   isoenzymes normal, presumed related to vit D deficiency   Blood transfusion without reported diagnosis    with prior gynecology surgery   Diabetes mellitus, type 2 (HCC) 2024   Hyperlipidemia    Hypertension 2010   Impaired fasting blood sugar 2019   Wears glasses    Current Outpatient Medications on File Prior to Visit  Medication Sig Dispense Refill   Blood Glucose Monitoring Suppl (ACCU-CHEK GUIDE ME) w/Device KIT USE AS DIRECTED 1 kit 0   Cholecalciferol (VITAMIN D3) 50 MCG (2000 UT) capsule TAKE 1 CAPSULE BY MOUTH EVERY DAY 90 capsule 0   metoprolol  succinate (TOPROL -XL) 25 MG 24 hr tablet Take 1 tablet (25 mg total) by mouth daily. 90 tablet 3   rosuvastatin  (CRESTOR ) 10 MG tablet Take 1 tablet (10 mg total) by mouth daily. 90 tablet 3   Semaglutide  (RYBELSUS ) 7 MG TABS Take 1 tablet (7 mg total) by mouth daily. 30 tablet 1   valsartan -hydrochlorothiazide  (DIOVAN  HCT) 160-12.5 MG tablet Take 1 tablet by mouth daily. 90 tablet 3   venlafaxine  XR (EFFEXOR -XR) 37.5 MG 24 hr capsule TAKE 1 CAPSULE BY MOUTH DAILY WITH BREAKFAST. 30 capsule 1   No current facility-administered medications on file prior to visit.   The following portions of the patient's history were reviewed and updated as  appropriate: allergies, current medications, past family history, past medical history, past social history, past surgical history and problem list.  ROS Otherwise as in subjective above     Objective: BP 118/72   Pulse 76   Wt 132 lb 9.6 oz (60.1 kg)   LMP 12/27/2016   BMI 24.25 kg/m   Wt Readings from Last 3 Encounters:  06/08/24 132 lb 9.6 oz (60.1 kg)  02/06/24 141 lb (64 kg)  06/30/23 157 lb 12.8 oz (71.6 kg)   BP Readings from Last 3 Encounters:  06/08/24 118/72  02/06/24 124/80  06/30/23 124/64    General appearance: alert, no distress, well developed, well nourished Neck: supple, no lymphadenopathy, no thyromegaly, no masses Heart: RRR, normal S1, S2, no murmurs Lungs: CTA bilaterally, no wheezes, rhonchi, or rales Pulses: 2+ radial pulses, 2+ pedal pulses, normal cap refill Ext: no edema  Diabetic Foot Exam - Simple   Simple Foot Form Diabetic Foot exam was performed with the following findings: Yes 06/08/2024  8:30 AM  Visual Inspection No deformities, no ulcerations, no other skin breakdown bilaterally: Yes Sensation Testing Intact to touch and monofilament testing bilaterally: Yes Pulse Check Posterior Tibialis and Dorsalis pulse intact bilaterally: Yes Comments       Assessment: Encounter Diagnoses  Name Primary?   Type 2 diabetes mellitus with hyperglycemia, without long-term current use of insulin (HCC) Yes   Hyperlipidemia, unspecified hyperlipidemia type  Essential hypertension, benign    Vitamin D  deficiency      Plan: Diabetes-doing fine on current Rybelsus  7 mg daily.  Hemoglobin A1c at goal.  Continue glucose monitoring, healthy diet and exercise.  Hypertension-continue Toprol -XL 25 mg daily, valsartan  HCT 160/12.5 mg daily  Hyperlipidemia-continue rosuvastatin  Crestor  10 mg daily  History of vitamin D  deficiency-continue vitamin D  supplement  Counseled on the need for weightbearing exercise in addition to her aerobic  exercise.   Tene was seen today for other.  Diagnoses and all orders for this visit:  Type 2 diabetes mellitus with hyperglycemia, without long-term current use of insulin (HCC) -     HgB A1c  Hyperlipidemia, unspecified hyperlipidemia type  Essential hypertension, benign  Vitamin D  deficiency    Follow up: in 3-4 months for physical

## 2024-07-05 ENCOUNTER — Encounter: Payer: Commercial Managed Care - PPO | Admitting: Medical

## 2024-08-06 ENCOUNTER — Ambulatory Visit: Admitting: Medical

## 2024-08-06 ENCOUNTER — Encounter: Payer: Self-pay | Admitting: Medical

## 2024-08-06 VITALS — BP 120/80 | HR 74 | Ht 62.25 in | Wt 133.0 lb

## 2024-08-06 DIAGNOSIS — Z23 Encounter for immunization: Secondary | ICD-10-CM | POA: Diagnosis not present

## 2024-08-06 DIAGNOSIS — Z7185 Encounter for immunization safety counseling: Secondary | ICD-10-CM

## 2024-08-06 DIAGNOSIS — R41840 Attention and concentration deficit: Secondary | ICD-10-CM

## 2024-08-06 DIAGNOSIS — E559 Vitamin D deficiency, unspecified: Secondary | ICD-10-CM

## 2024-08-06 DIAGNOSIS — E785 Hyperlipidemia, unspecified: Secondary | ICD-10-CM

## 2024-08-06 DIAGNOSIS — Z0000 Encounter for general adult medical examination without abnormal findings: Secondary | ICD-10-CM

## 2024-08-06 DIAGNOSIS — I10 Essential (primary) hypertension: Secondary | ICD-10-CM

## 2024-08-06 DIAGNOSIS — F324 Major depressive disorder, single episode, in partial remission: Secondary | ICD-10-CM

## 2024-08-06 DIAGNOSIS — E1165 Type 2 diabetes mellitus with hyperglycemia: Secondary | ICD-10-CM

## 2024-08-06 MED ORDER — COVID-19 MRNA VACCINE (PFIZER) 30 MCG/0.3ML IM SUSP: 0.3000 mL | Freq: Once | INTRAMUSCULAR | 0 refills | Status: AC

## 2024-08-06 NOTE — Progress Notes (Signed)
 Name: Anne Bender   Date of Visit: 08/06/24   Date of last visit with me: 06/08/2024   CHIEF COMPLAINT:  Chief Complaint  Patient presents with   Annual Exam    Fasting cpe, would like flu shot today. No concerns. Will wait on the pneumonia shot. Due for eye exam       HPI:  Discussed the use of AI scribe software for clinical note transcription with the patient, who gave verbal consent to proceed.  History of Present Illness   Patient Care Team: Dasani Crear, Alm RAMAN, PA-C as PCP - General (Family Medicine) Ob/Gyn, [facility] (Obstetrics and Gynecology) Sees dentist Sees eye doctor Dr. Renaye Sous, GI Gynecology, Dr. Elson All   Concerns: Anne Bender is a 53 year old female who presents for a well visit.  She is currently taking Effexor  37.5 mg, valsartan  HCT, Toprol , Crestor  10 mg, Rybelsus  7 mg, and vitamin D  2000 IU daily. She monitors her blood glucose levels at home, typically recording values around 95 or 97, and checks approximately once a week to manage anxiety.  She maintains an active lifestyle, mother of 3, also caring for two dogs, which she describes as similar to having twin toddlers. She walks them separately for about an hour daily and remains active in the backyard and around the house.   Diabetes -compliant with Rybelsus  7mg  daily  No new changes in her family health history over the past year.  Hypertension-compliant with metoprolol  25 mg XL daily along with valsartan  HCT 160/12.5 mg daily  Hyperlipidemia-compliant with Crestor  10 mg daily without complaint   Vitamin D  deficiency-compliant with vitamin D  2000 units daily   Reviewed their medical, surgical, family, social, medication, and allergy history and updated chart as appropriate.  Past Medical History:  Diagnosis Date   Alkaline phosphatase elevation 2019   isoenzymes normal, presumed related to vit D deficiency   Blood transfusion without reported diagnosis    with prior  gynecology surgery   Diabetes mellitus, type 2 (HCC) 2024   Hyperlipidemia    Hypertension 2010   Impaired fasting blood sugar 2019   Wears glasses     Family History  Problem Relation Age of Onset   Stroke Mother 28   Hypertension Mother    Heart disease Mother 43       pacemaker, CHF   Pulmonary embolism Father    Hypertension Father    Peripheral vascular disease Father    Hypertension Sister    Breast cancer Maternal Aunt        34s   Cancer Maternal Aunt        breast   Breast cancer Paternal Grandmother        28s   Diabetes Paternal Grandmother    Hypertension Paternal Grandmother    Cancer Paternal Grandmother        breast   Hypertension Brother    Hypertension Brother      Current Outpatient Medications:    Cholecalciferol (VITAMIN D3) 50 MCG (2000 UT) capsule, TAKE 1 CAPSULE BY MOUTH EVERY DAY, Disp: 90 capsule, Rfl: 0   COVID-19 mRNA vaccine, Pfizer, 30 MCG/0.3ML injection, Inject 0.3 mLs into the muscle once for 1 dose., Disp: 0.3 mL, Rfl: 0   metoprolol  succinate (TOPROL -XL) 25 MG 24 hr tablet, Take 1 tablet (25 mg total) by mouth daily., Disp: 90 tablet, Rfl: 2   rosuvastatin  (CRESTOR ) 10 MG tablet, Take 1 tablet (10 mg total) by mouth daily., Disp: 90 tablet, Rfl:  2   Semaglutide  (RYBELSUS ) 7 MG TABS, Take 1 tablet (7 mg total) by mouth daily., Disp: 90 tablet, Rfl: 2   valsartan -hydrochlorothiazide  (DIOVAN  HCT) 160-12.5 MG tablet, Take 1 tablet by mouth daily., Disp: 90 tablet, Rfl: 2   venlafaxine  XR (EFFEXOR -XR) 37.5 MG 24 hr capsule, Take 1 capsule (37.5 mg total) by mouth daily with breakfast., Disp: 90 capsule, Rfl: 2   Blood Glucose Monitoring Suppl (ACCU-CHEK GUIDE ME) w/Device KIT, USE AS DIRECTED, Disp: 1 kit, Rfl: 0  Allergies  Allergen Reactions   Phentermine      Insomnia     Review of Systems  Constitutional:  Negative for chills, fever, malaise/fatigue and weight loss.  HENT:  Negative for congestion, ear pain, hearing loss, sore  throat and tinnitus.   Eyes:  Negative for blurred vision, pain and redness.  Respiratory:  Negative for cough, hemoptysis and shortness of breath.   Cardiovascular:  Negative for chest pain, palpitations, orthopnea, claudication and leg swelling.  Gastrointestinal:  Negative for abdominal pain, blood in stool, constipation, diarrhea, nausea and vomiting.  Genitourinary:  Negative for dysuria, flank pain, frequency, hematuria and urgency.  Musculoskeletal:  Negative for falls, joint pain and myalgias.  Skin:  Negative for itching and rash.  Neurological:  Negative for dizziness, tingling, speech change, weakness and headaches.  Endo/Heme/Allergies:  Negative for polydipsia. Does not bruise/bleed easily.  Psychiatric/Behavioral:  Negative for depression and memory loss. The patient is not nervous/anxious and does not have insomnia.          06/08/2024    8:07 AM 06/30/2023    8:26 AM 06/24/2022   10:39 AM 08/12/2021    3:00 PM 05/06/2021    8:34 AM  Depression screen PHQ 2/9  Decreased Interest 0 2 0 0 1  Down, Depressed, Hopeless 0 2 0 0 1  PHQ - 2 Score 0 4 0 0 2  Altered sleeping  3   1  Tired, decreased energy  3   1  Change in appetite  3   1  Feeling bad or failure about yourself   3   0  Trouble concentrating  3   0  Moving slowly or fidgety/restless  0   0  Suicidal thoughts  0   0  PHQ-9 Score  19   5  Difficult doing work/chores  Somewhat difficult   Somewhat difficult       Objective:  BP 120/80   Pulse 74   Ht 5' 2.25 (1.581 m)   Wt 133 lb (60.3 kg)   LMP 12/27/2016   BMI 24.13 kg/m   Wt Readings from Last 3 Encounters:  08/06/24 133 lb (60.3 kg)  06/08/24 132 lb 9.6 oz (60.1 kg)  02/06/24 141 lb (64 kg)   BP Readings from Last 3 Encounters:  08/06/24 120/80  06/08/24 118/72  02/06/24 124/80   General appearance: alert, no distress, WD/WN, African American female Skin: unremarkable HEENT: normocephalic, conjunctiva/corneas normal, sclerae anicteric,  PERRLA, EOMi, nares patent, no discharge or erythema, pharynx normal Neck: supple, no lymphadenopathy, no thyromegaly, no masses, normal ROM, no bruits Chest: non tender, normal shape and expansion Heart: RRR, normal S1, S2, no murmurs Lungs: CTA bilaterally, no wheezes, rhonchi, or rales Abdomen: +bs, soft, non tender, non distended, no masses, no hepatomegaly, no splenomegaly, no bruits Back: non tender, normal ROM, no scoliosis Musculoskeletal: upper extremities non tender, no obvious deformity, normal ROM throughout, lower extremities non tender, no obvious deformity, normal ROM throughout Extremities: no edema,  no cyanosis, no clubbing Pulses: 2+ symmetric, upper and lower extremities, normal cap refill Neurological: alert, oriented x 3, CN2-12 intact, strength normal upper extremities and lower extremities, sensation normal throughout, DTRs 2+ throughout, no cerebellar signs, gait normal Psychiatric: normal affect, behavior normal, pleasant  Breast/gyn/rectal - deferred to gynecology   Assessment and Plan :   Encounter Diagnoses  Name Primary?   Encounter for health maintenance examination in adult Yes   Needs flu shot    Essential hypertension, benign    Hyperlipidemia, unspecified hyperlipidemia type    Type 2 diabetes mellitus with hyperglycemia, without long-term current use of insulin (HCC)    Vaccine counseling    Vitamin D  deficiency    Attention and concentration deficit    Depression, major, single episode, in partial remission (HCC)     This visit was a preventative care visit, also known as wellness visit or routine physical.   Topics typically include healthy lifestyle, diet, exercise, preventative care, vaccinations, sick and well care, proper use of emergency dept and after hours care, as well as other concerns.     Recommendations: Continue to return yearly for your annual wellness and preventative care visits.  This gives us  a chance to discuss healthy  lifestyle, exercise, vaccinations, review your chart record, and perform screenings where appropriate.  I recommend you see your eye doctor yearly for routine vision care.  I recommend you see your dentist yearly for routine dental care including hygiene visits twice yearly.  See your gynecologist yearly for routine gynecological care.   Vaccination recommendations were reviewed Immunization History  Administered Date(s) Administered   Influenza, Seasonal, Injecte, Preservative Fre 08/06/2024   Influenza,inj,Quad PF,6+ Mos 08/09/2017, 08/17/2018, 09/26/2019, 08/12/2021   PFIZER(Purple Top)SARS-COV-2 Vaccination 12/30/2019, 02/15/2020, 03/07/2020   PPD Test 05/24/2024   Td 12/14/2011   Tdap 06/24/2022    Counseled on the influenza virus vaccine.  Influenza vaccine given after consent obtained.  Counseled on pneumococcal and shingrix as well.     Screening for cancer: Colon cancer screening: Reviewed 06/2022 colonoscopy, due repeat in 10 years  Reviewed 2021 pap and 2023 breast imaging.  She notes recent 2024 pap with gyn  Skin cancer screening: Check your skin regularly for new changes, growing lesions, or other lesions of concern Come in for evaluation if you have skin lesions of concern.  Lung cancer screening: If you have a greater than 20 pack year history of tobacco use, then you may qualify for lung cancer screening with a chest CT scan.   Please call your insurance company to inquire about coverage for this test.  We currently don't have screenings for other cancers besides breast, cervical, colon, and lung cancers.  If you have a strong family history of cancer or have other cancer screening concerns, please let me know.    Bone health: Get at least 150 minutes of aerobic exercise weekly Get weight bearing exercise at least once weekly Bone density test:  A bone density test is an imaging test that uses a type of X-ray to measure the amount of calcium  and other  minerals in your bones. The test may be used to diagnose or screen you for a condition that causes weak or thin bones (osteoporosis), predict your risk for a broken bone (fracture), or determine how well your osteoporosis treatment is working. The bone density test is recommended for females 65 and older, or females or males <65 if certain risk factors such as thyroid  disease, long term use of steroids  such as for asthma or rheumatological issues, vitamin D  deficiency, estrogen deficiency, family history of osteoporosis, self or family history of fragility fracture in first degree relative.    Heart health: Get at least 150 minutes of aerobic exercise weekly Limit alcohol It is important to maintain a healthy blood pressure and healthy cholesterol numbers  Heart disease screening: Screening for heart disease includes screening for blood pressure, fasting lipids, glucose/diabetes screening, BMI height to weight ratio, reviewed of smoking status, physical activity, and diet.    Goals include blood pressure 120/80 or less, maintaining a healthy lipid/cholesterol profile, preventing diabetes or keeping diabetes numbers under good control, not smoking or using tobacco products, exercising most days per week or at least 150 minutes per week of exercise, and eating healthy variety of fruits and vegetables, healthy oils, and avoiding unhealthy food choices like fried food, fast food, high sugar and high cholesterol foods.      Medical care options: I recommend you continue to seek care here first for routine care.  We try really hard to have available appointments Monday through Friday daytime hours for sick visits, acute visits, and physicals.  Urgent care should be used for after hours and weekends for significant issues that cannot wait till the next day.  The emergency department should be used for significant potentially life-threatening emergencies.  The emergency department is expensive, can often  have long wait times for less significant concerns, so try to utilize primary care, urgent care, or telemedicine when possible to avoid unnecessary trips to the emergency department.  Virtual visits and telemedicine have been introduced since the pandemic started in 2020, and can be convenient ways to receive medical care.  We offer virtual appointments as well to assist you in a variety of options to seek medical care.    Separate significant issues discussed: Vit D deficiency - continue vit D 2000 u daily, labs today  Hypertension Blood pressure well-controlled on valsartan  HCT 160/12.5mg  daily and Toprol  XL 25mg  daily  Type 2 Diabetes Mellitus - A1c and microalbumin not due for recheck. - Order blood work including liver, kidney, electrolyte, blood count, and cholesterol tests. -continue Rybelsus  7mg  daily  Hyperlipidemia Cholesterol managed with Crestor  10mg  daily  Depression Mood well-controlled on Effexor  37.5 mg. - Continue Effexor  37.5 mg.  Vitamin D  deficiency Taking 2000 IU of vitamin D  daily. Spending time outdoors regularly. - Check vitamin D  levels.    Anne Bender was seen today for annual exam.  Diagnoses and all orders for this visit:  Encounter for health maintenance examination in adult -     CBC -     Comprehensive metabolic panel with GFR -     Lipid panel -     VITAMIN D  25 Hydroxy (Vit-D Deficiency, Fractures)  Needs flu shot -     Flu vaccine trivalent PF, 6mos and older(Flulaval,Afluria,Fluarix,Fluzone)  Essential hypertension, benign  Hyperlipidemia, unspecified hyperlipidemia type -     Lipid panel  Type 2 diabetes mellitus with hyperglycemia, without long-term current use of insulin (HCC)  Vaccine counseling  Vitamin D  deficiency -     VITAMIN D  25 Hydroxy (Vit-D Deficiency, Fractures)  Attention and concentration deficit  Depression, major, single episode, in partial remission (HCC)  Other orders -     COVID-19 mRNA vaccine, Pfizer, 30  MCG/0.3ML injection; Inject 0.3 mLs into the muscle once for 1 dose.    Follow-up pending labs, yearly for physical

## 2024-08-07 ENCOUNTER — Other Ambulatory Visit: Payer: Self-pay | Admitting: Medical

## 2024-08-07 ENCOUNTER — Ambulatory Visit: Payer: Self-pay | Admitting: Medical

## 2024-08-07 LAB — COMPREHENSIVE METABOLIC PANEL WITH GFR
ALT: 30 IU/L (ref 0–32)
AST: 29 IU/L (ref 0–40)
Albumin: 4.2 g/dL (ref 3.8–4.9)
Alkaline Phosphatase: 104 IU/L (ref 44–121)
BUN/Creatinine Ratio: 18 (ref 9–23)
BUN: 14 mg/dL (ref 6–24)
Bilirubin Total: 0.4 mg/dL (ref 0.0–1.2)
CO2: 22 mmol/L (ref 20–29)
Calcium: 9.2 mg/dL (ref 8.7–10.2)
Chloride: 103 mmol/L (ref 96–106)
Creatinine, Ser: 0.79 mg/dL (ref 0.57–1.00)
Globulin, Total: 2.6 g/dL (ref 1.5–4.5)
Glucose: 86 mg/dL (ref 70–99)
Potassium: 3.8 mmol/L (ref 3.5–5.2)
Sodium: 140 mmol/L (ref 134–144)
Total Protein: 6.8 g/dL (ref 6.0–8.5)
eGFR: 89 mL/min/1.73 (ref >59)

## 2024-08-07 LAB — LIPID PANEL
Chol/HDL Ratio: 3.3 ratio (ref 0.0–4.4)
Cholesterol, Total: 124 mg/dL (ref 100–199)
HDL: 38 mg/dL — ABNORMAL LOW (ref >39)
LDL Chol Calc (NIH): 75 mg/dL (ref 0–99)
Triglycerides: 46 mg/dL (ref 0–149)
VLDL Cholesterol Cal: 11 mg/dL (ref 5–40)

## 2024-08-07 LAB — CBC
Hematocrit: 39.8 % (ref 34.0–46.6)
Hemoglobin: 12.8 g/dL (ref 11.1–15.9)
MCH: 30.5 pg (ref 26.6–33.0)
MCHC: 32.2 g/dL (ref 31.5–35.7)
MCV: 95 fL (ref 79–97)
Platelets: 291 x10E3/uL (ref 150–450)
RBC: 4.19 x10E6/uL (ref 3.77–5.28)
RDW: 12.8 % (ref 11.7–15.4)
WBC: 5.6 x10E3/uL (ref 3.4–10.8)

## 2024-08-07 LAB — VITAMIN D 25 HYDROXY (VIT D DEFICIENCY, FRACTURES): Vit D, 25-Hydroxy: 57.4 ng/mL (ref 30.0–100.0)

## 2024-08-07 NOTE — Progress Notes (Signed)
Results to mychart.

## 2024-08-13 ENCOUNTER — Telehealth: Payer: Self-pay | Admitting: Internal Medicine

## 2024-08-13 ENCOUNTER — Ambulatory Visit: Payer: Self-pay | Admitting: Medical

## 2024-08-13 NOTE — Telephone Encounter (Signed)
-----   Message from Ludie Gent sent at 08/13/2024 11:56 AM EDT ----- Brian pap/other gyn ----- Message ----- From: Harrison Setter D Sent: 08/13/2024  11:41 AM EDT To: Alm GORMAN Gent, PA-C

## 2024-08-13 NOTE — Telephone Encounter (Signed)
Abstracted information.  

## 2024-08-13 NOTE — Progress Notes (Signed)
 Results thru my chart

## 2024-08-14 NOTE — Telephone Encounter (Signed)
 Left detailed message that mammogram was normal

## 2024-09-25 ENCOUNTER — Other Ambulatory Visit: Payer: Self-pay | Admitting: Medical

## 2024-10-30 ENCOUNTER — Encounter: Payer: Self-pay | Admitting: Family Medicine

## 2024-10-30 ENCOUNTER — Ambulatory Visit: Admitting: Family Medicine

## 2024-10-30 VITALS — BP 122/80 | HR 72 | Wt 130.8 lb

## 2024-10-30 DIAGNOSIS — R0981 Nasal congestion: Secondary | ICD-10-CM | POA: Diagnosis not present

## 2024-10-30 DIAGNOSIS — R42 Dizziness and giddiness: Secondary | ICD-10-CM

## 2024-10-30 NOTE — Progress Notes (Signed)
   Name: Anne Bender   Date of Visit: 10/30/24   Date of last visit with me: Visit date not found   CHIEF COMPLAINT:  Chief Complaint  Patient presents with   Acute Visit    Possible ear infection. Been going on for a couple of days, ears don't bother her just when she puts her head back or down she gets dizzy.        HPI:  Discussed the use of AI scribe software for clinical note transcription with the patient, who gave verbal consent to proceed.  History of Present Illness   Anne Bender is a 53 year old female who presents with dizziness and ear discomfort.  She experiences dizziness, particularly when laying down or looking down and then returning to an upright position. The sensation is described as feeling 'a little too dizzy'.  Her water intake has been low, which may be contributing to her symptoms. She acknowledges that her hydration has been 'probably a little low'.  She reports discomfort in her ear without feeling clogged up. She acknowledges fluid behind her ear.  She is considering traveling by airplane and plans to visit [facility], [place] , to see her best friend who was recently diagnosed with a rare form of cancer.         OBJECTIVE:       06/08/2024    8:07 AM  Depression screen PHQ 2/9  Decreased Interest 0  Down, Depressed, Hopeless 0  PHQ - 2 Score 0     BP Readings from Last 3 Encounters:  10/30/24 122/80  08/06/24 120/80  06/08/24 118/72    BP 122/80   Pulse 72   Wt 130 lb 12.8 oz (59.3 kg)   LMP 12/27/2016   SpO2 99%   BMI 23.73 kg/m    Physical Exam   HEENT: Fluid behind ear, no infection. Sinus congestion.      Physical Exam Constitutional:      Appearance: Normal appearance.  HENT:     Right Ear: Tympanic membrane, ear canal and external ear normal. There is no impacted cerumen.     Left Ear: Tympanic membrane, ear canal and external ear normal. There is no impacted cerumen.  Neurological:     General: No focal deficit  present.     Mental Status: She is oriented to person, place, and time. Mental status is at baseline.     ASSESSMENT/PLAN:   Assessment & Plan Dizziness  Sinus congestion    Assessment and Plan    Sinus congestion with eustachian tube dysfunction Sinus congestion with fluid behind the ear causing equilibrium issues. No infection. Likely worsened by dehydration. - Use Flonase nightly for two months. - Use a humidifier in the room. - Increase water intake. - Consume spicy food to aid sinus drainage.  Dizziness likely secondary to dehydration and sinus congestion Dizziness likely due to dehydration and sinus congestion. Normal blood pressure. Dehydration suspected as primary cause. - Increase water intake. - Use Liquid IV for hydration, one for every three to four cups of water. - Ensure adequate hydration before air travel.         Omah Dewalt A. Vita MD [facility]

## 2025-08-07 ENCOUNTER — Encounter: Payer: Self-pay | Admitting: Family Medicine
